# Patient Record
Sex: Male | Born: 1955
Health system: Southern US, Community
[De-identification: ages and names within clinical notes are randomized; demographics above are authoritative.]

## PROBLEM LIST (undated history)

## (undated) DIAGNOSIS — J449 Chronic obstructive pulmonary disease, unspecified: Secondary | ICD-10-CM

## (undated) DIAGNOSIS — G8929 Other chronic pain: Secondary | ICD-10-CM

## (undated) DIAGNOSIS — B029 Zoster without complications: Secondary | ICD-10-CM

## (undated) DIAGNOSIS — G40909 Epilepsy, unspecified, not intractable, without status epilepticus: Secondary | ICD-10-CM

## (undated) DIAGNOSIS — M549 Dorsalgia, unspecified: Secondary | ICD-10-CM

## (undated) DIAGNOSIS — Z973 Presence of spectacles and contact lenses: Secondary | ICD-10-CM

## (undated) DIAGNOSIS — J40 Bronchitis, not specified as acute or chronic: Secondary | ICD-10-CM

## (undated) HISTORY — DX: Dorsalgia, unspecified: M54.9

## (undated) HISTORY — DX: Other chronic pain: G89.29

## (undated) HISTORY — DX: Chronic obstructive pulmonary disease, unspecified: J44.9

## (undated) HISTORY — DX: Epilepsy, unspecified, not intractable, without status epilepticus: G40.909

## (undated) HISTORY — PX: MANDIBLE SURGERY: SHX707

## (undated) HISTORY — DX: Presence of spectacles and contact lenses: Z97.3

## (undated) HISTORY — DX: Bronchitis, not specified as acute or chronic: J40

## (undated) HISTORY — PX: TONSILLECTOMY AND ADENOIDECTOMY: SUR1326

## (undated) HISTORY — DX: Zoster without complications: B02.9

---

## 2004-03-22 ENCOUNTER — Emergency Department (HOSPITAL_COMMUNITY): Admission: EM | Admit: 2004-03-22 | Discharge: 2004-03-22 | Payer: Self-pay | Admitting: *Deleted

## 2004-06-08 ENCOUNTER — Emergency Department (HOSPITAL_COMMUNITY): Admission: EM | Admit: 2004-06-08 | Discharge: 2004-06-08 | Payer: Self-pay | Admitting: Emergency Medicine

## 2004-08-16 ENCOUNTER — Emergency Department (HOSPITAL_COMMUNITY): Admission: EM | Admit: 2004-08-16 | Discharge: 2004-08-16 | Payer: Self-pay | Admitting: Emergency Medicine

## 2007-07-27 ENCOUNTER — Emergency Department (HOSPITAL_COMMUNITY): Admission: EM | Admit: 2007-07-27 | Discharge: 2007-07-27 | Payer: Self-pay | Admitting: Emergency Medicine

## 2007-08-27 ENCOUNTER — Emergency Department (HOSPITAL_COMMUNITY): Admission: EM | Admit: 2007-08-27 | Discharge: 2007-08-27 | Payer: Self-pay | Admitting: Emergency Medicine

## 2008-11-07 IMAGING — CR DG CHEST 2V
2 series · 2 of 2 positions shown · non-contrast
Comparison: 07/27/2007.

CLINICAL DATA: Increasing upper back pain.  Recently diagnosed
right rib fracture.

CHEST - 2 VIEW

[w chest pa]
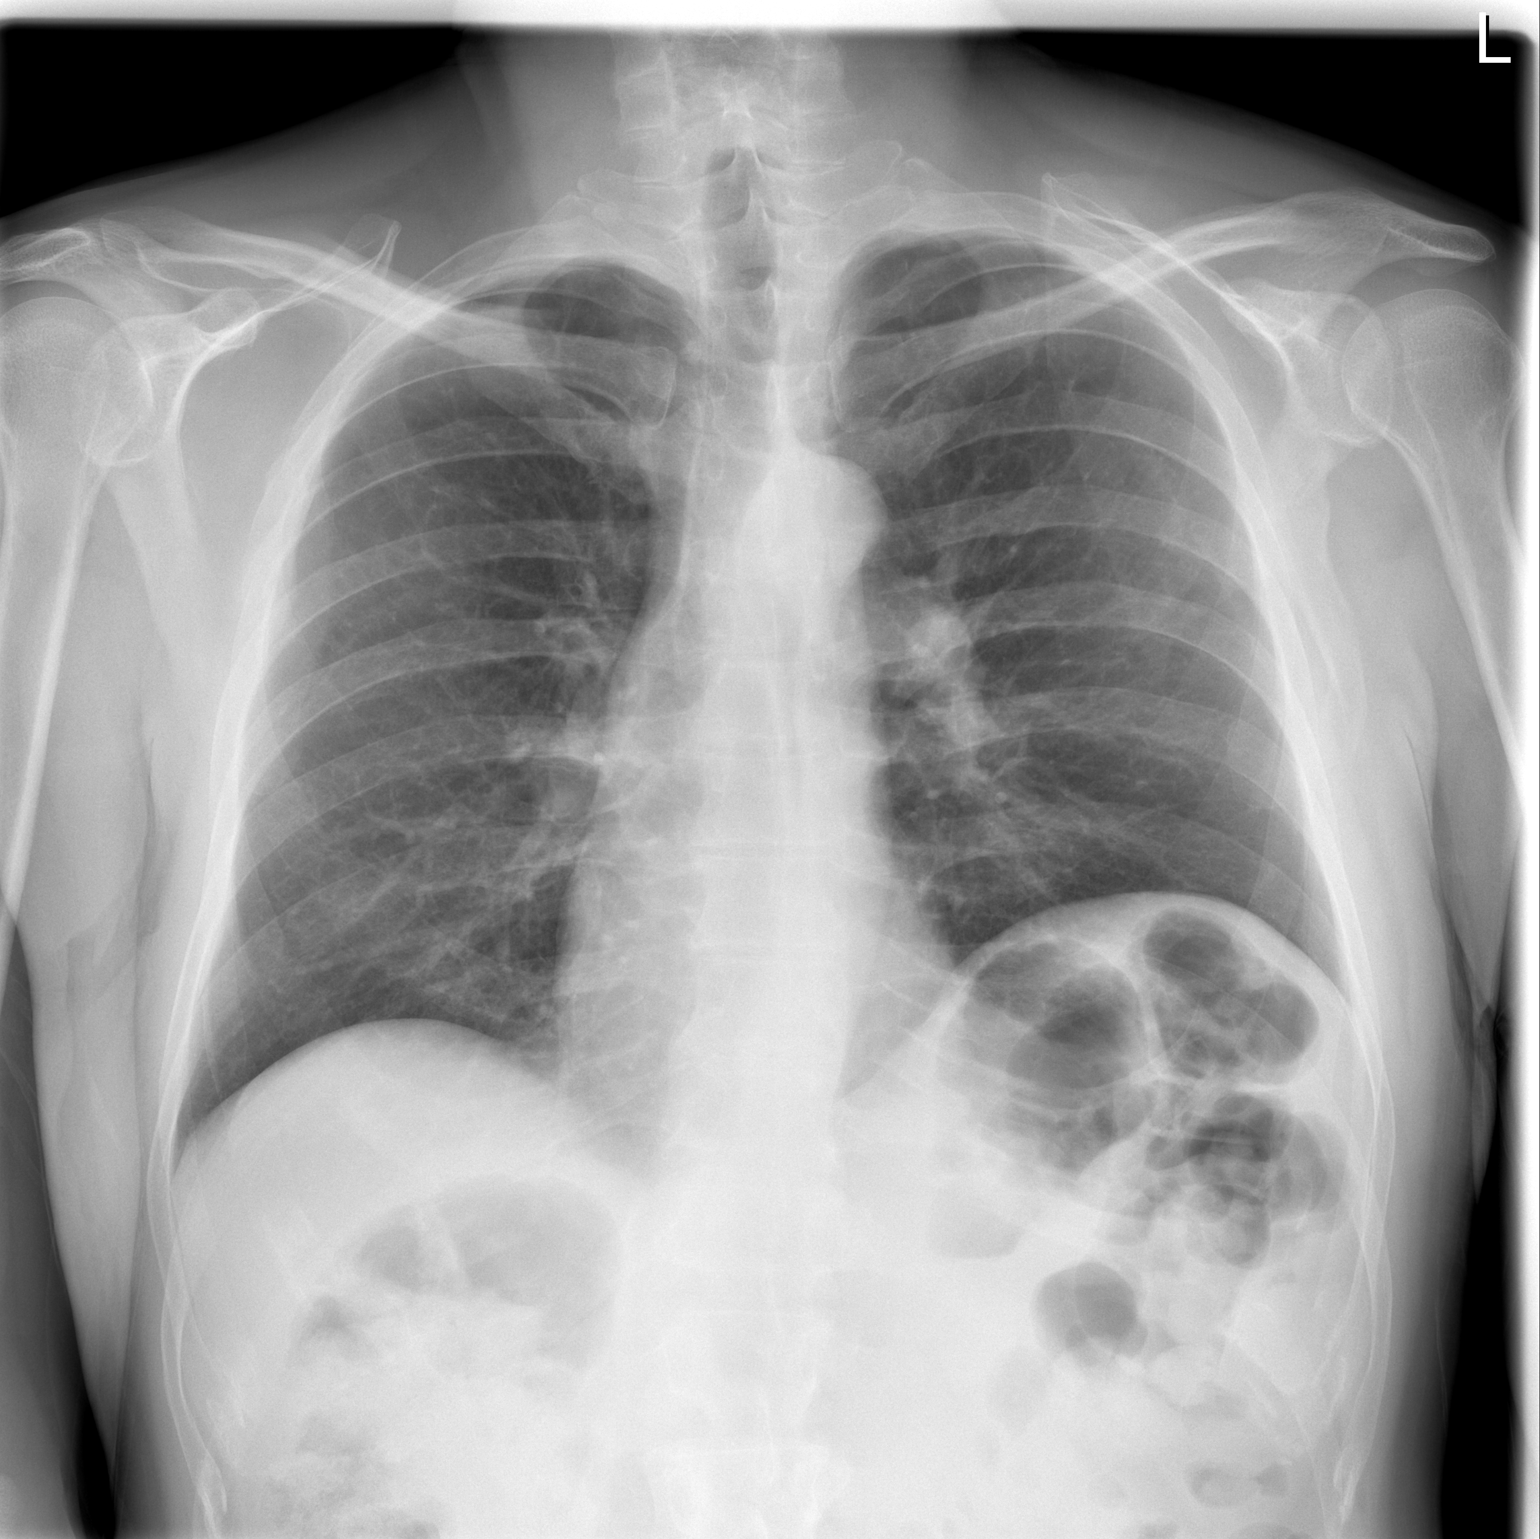

[w chest lat]
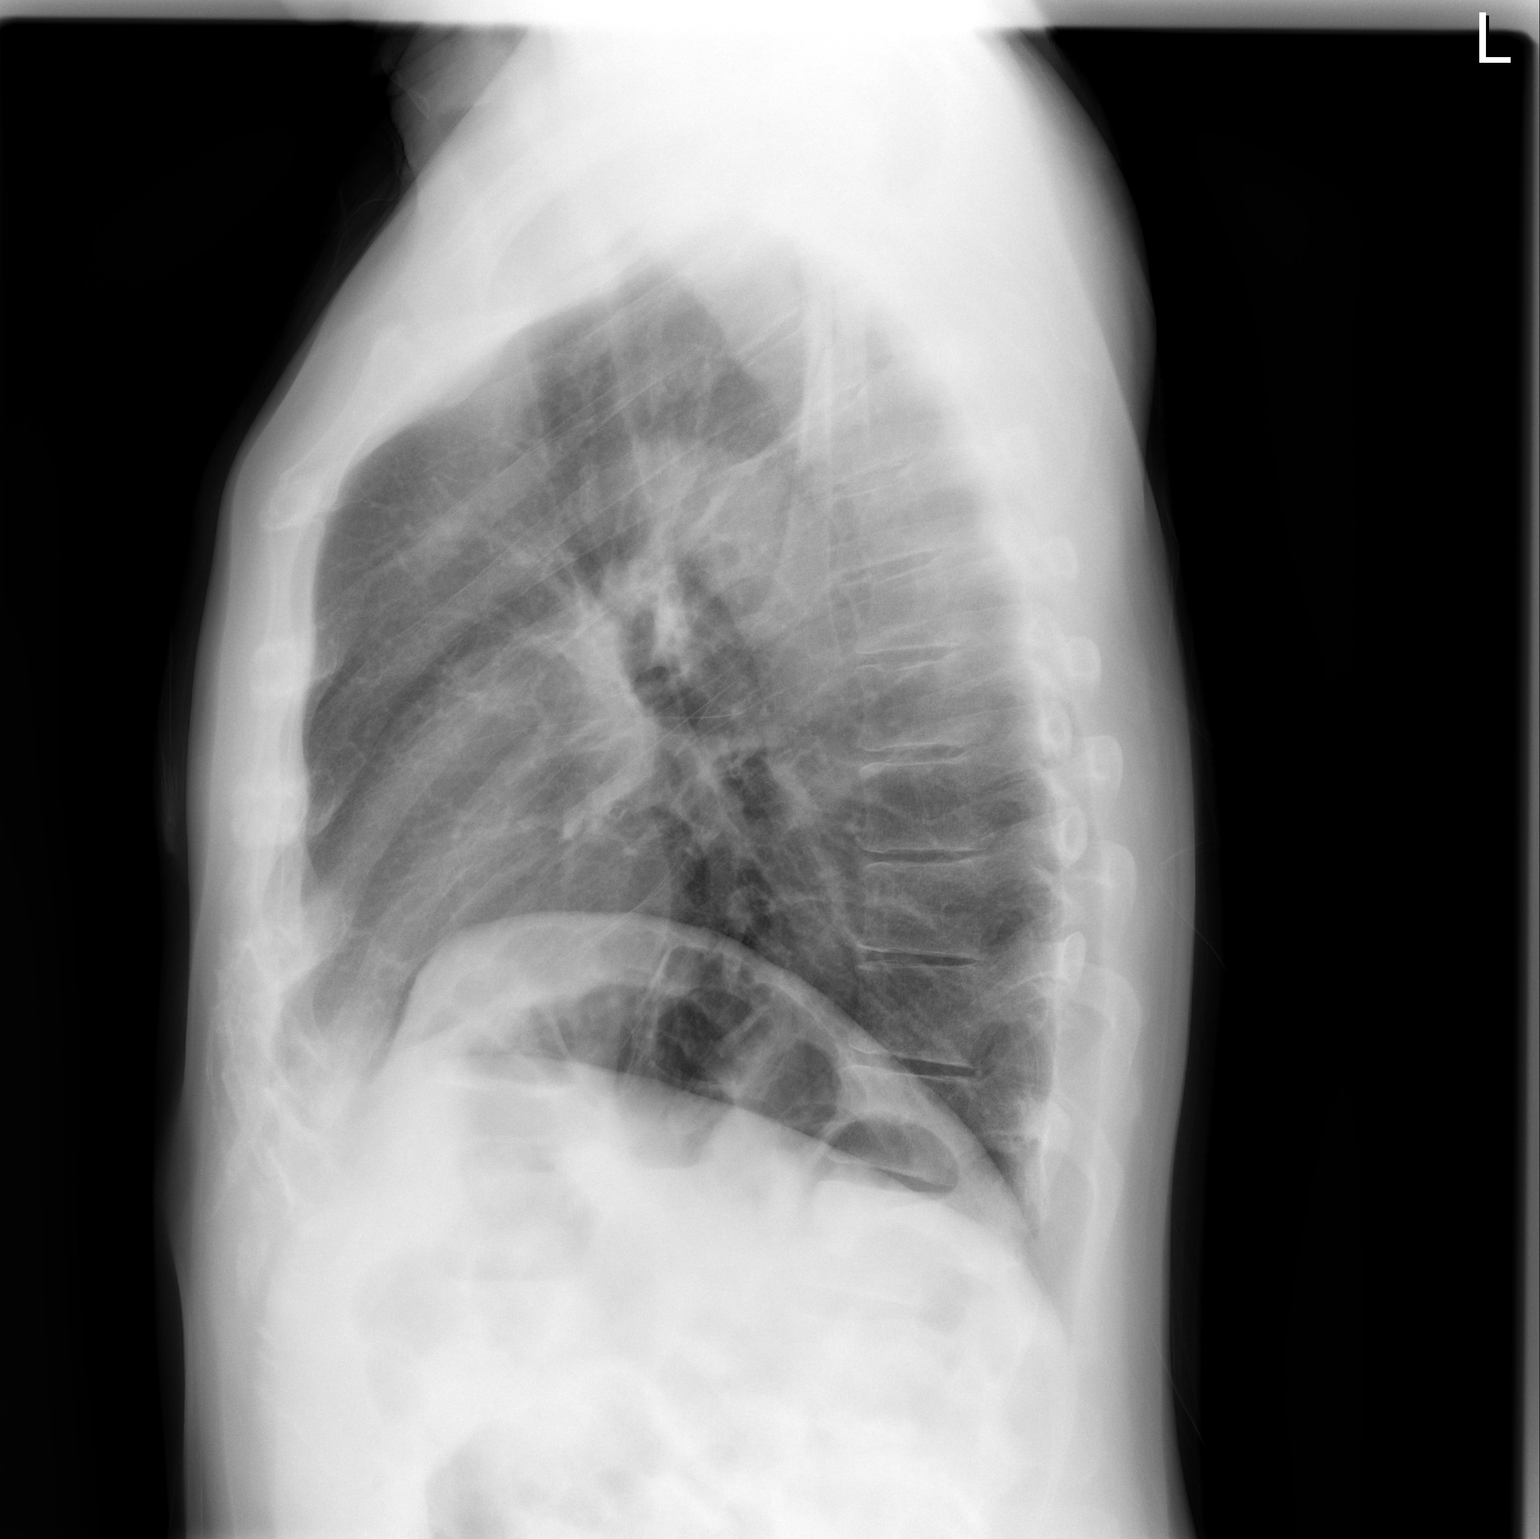

[2 of 2 positions shown; findings below may reference images not displayed]

FINDINGS: Interval partial healing of the previously seen right
lateral fifth rib fracture.  Stable old, healed right lateral 4th
rib fracture.  Normal sized heart.  Clear lungs.  Stable mild
elevation of the left hemidiaphragm.  Mild scoliosis.
IMPRESSION: Healing right fifth rib fracture.  No acute abnormality.

## 2009-06-08 ENCOUNTER — Emergency Department (HOSPITAL_BASED_OUTPATIENT_CLINIC_OR_DEPARTMENT_OTHER): Admission: EM | Admit: 2009-06-08 | Discharge: 2009-06-08 | Payer: Self-pay | Admitting: Emergency Medicine

## 2009-06-25 ENCOUNTER — Emergency Department (HOSPITAL_BASED_OUTPATIENT_CLINIC_OR_DEPARTMENT_OTHER): Admission: EM | Admit: 2009-06-25 | Discharge: 2009-06-25 | Payer: Self-pay | Admitting: Emergency Medicine

## 2009-07-01 ENCOUNTER — Emergency Department (HOSPITAL_BASED_OUTPATIENT_CLINIC_OR_DEPARTMENT_OTHER): Admission: EM | Admit: 2009-07-01 | Discharge: 2009-07-01 | Payer: Self-pay | Admitting: Emergency Medicine

## 2010-03-02 ENCOUNTER — Emergency Department (INDEPENDENT_AMBULATORY_CARE_PROVIDER_SITE_OTHER)
Admission: EM | Admit: 2010-03-02 | Discharge: 2010-03-02 | Payer: Self-pay | Source: Home / Self Care | Admitting: Emergency Medicine

## 2010-03-02 DIAGNOSIS — R109 Unspecified abdominal pain: Secondary | ICD-10-CM

## 2010-03-02 LAB — URINALYSIS, ROUTINE W REFLEX MICROSCOPIC
Bilirubin Urine: NEGATIVE
Hgb urine dipstick: NEGATIVE
Ketones, ur: NEGATIVE mg/dL
Nitrite: NEGATIVE
Protein, ur: NEGATIVE mg/dL
Specific Gravity, Urine: 1.01 (ref 1.005–1.030)
Urine Glucose, Fasting: 500 mg/dL — AB
Urobilinogen, UA: 0.2 mg/dL (ref 0.0–1.0)
pH: 6.5 (ref 5.0–8.0)

## 2010-03-02 LAB — DIFFERENTIAL
Basophils Absolute: 0.1 K/uL (ref 0.0–0.1)
Basophils Relative: 3 % — ABNORMAL HIGH (ref 0–1)
Eosinophils Absolute: 0.2 10*3/uL (ref 0.0–0.7)
Eosinophils Relative: 4 % (ref 0–5)
Lymphocytes Relative: 33 % (ref 12–46)
Lymphs Abs: 1.9 10*3/uL (ref 0.7–4.0)
Monocytes Absolute: 0.6 K/uL (ref 0.1–1.0)
Monocytes Relative: 11 % (ref 3–12)
Neutro Abs: 2.8 K/uL (ref 1.7–7.7)
Neutrophils Relative %: 49 % (ref 43–77)

## 2010-03-02 LAB — COMPREHENSIVE METABOLIC PANEL WITH GFR
ALT: 53 U/L (ref 0–53)
AST: 39 U/L — ABNORMAL HIGH (ref 0–37)
Albumin: 4.3 g/dL (ref 3.5–5.2)
Alkaline Phosphatase: 130 U/L — ABNORMAL HIGH (ref 39–117)
BUN: 13 mg/dL (ref 6–23)
Chloride: 100 meq/L (ref 96–112)
GFR calc Af Amer: >60 mL/min (ref >60)
Potassium: 4.1 meq/L (ref 3.5–5.1)
Sodium: 139 meq/L (ref 135–145)
Total Bilirubin: 0.8 mg/dL (ref 0.3–1.2)
Total Protein: 7.5 g/dL (ref 6.0–8.3)

## 2010-03-02 LAB — COMPREHENSIVE METABOLIC PANEL
CO2: 27 mEq/L (ref 19–32)
Calcium: 9.5 mg/dL (ref 8.4–10.5)
Creatinine, Ser: 0.9 mg/dL (ref 0.4–1.5)
GFR calc non Af Amer: >60 mL/min (ref >60)
Glucose, Bld: 299 mg/dL — ABNORMAL HIGH (ref 70–99)

## 2010-03-02 LAB — CBC
HCT: 41.5 % (ref 39.0–52.0)
Hemoglobin: 15.2 g/dL (ref 13.0–17.0)
MCH: 29.9 pg (ref 26.0–34.0)
MCHC: 36.6 g/dL — ABNORMAL HIGH (ref 30.0–36.0)
MCV: 81.7 fL (ref 78.0–100.0)
Platelets: 196 10*3/uL (ref 150–400)
RBC: 5.08 MIL/uL (ref 4.22–5.81)
RDW: 12.6 % (ref 11.5–15.5)
WBC: 5.6 10*3/uL (ref 4.0–10.5)

## 2010-04-23 ENCOUNTER — Encounter (INDEPENDENT_AMBULATORY_CARE_PROVIDER_SITE_OTHER): Payer: Self-pay | Admitting: *Deleted

## 2010-04-23 LAB — CONVERTED CEMR LAB
Amphetamine Screen, Ur: NEGATIVE
Barbiturate Quant, Ur: NEGATIVE
Cocaine Metabolites: NEGATIVE
Phencyclidine (PCP): NEGATIVE

## 2010-05-13 ENCOUNTER — Other Ambulatory Visit: Payer: Self-pay | Admitting: General Surgery

## 2010-05-13 ENCOUNTER — Encounter (HOSPITAL_COMMUNITY): Payer: Self-pay | Attending: General Surgery

## 2010-05-13 DIAGNOSIS — K8020 Calculus of gallbladder without cholecystitis without obstruction: Secondary | ICD-10-CM | POA: Insufficient documentation

## 2010-05-13 DIAGNOSIS — Z01812 Encounter for preprocedural laboratory examination: Secondary | ICD-10-CM | POA: Insufficient documentation

## 2010-05-13 DIAGNOSIS — J449 Chronic obstructive pulmonary disease, unspecified: Secondary | ICD-10-CM | POA: Insufficient documentation

## 2010-05-13 DIAGNOSIS — Z01810 Encounter for preprocedural cardiovascular examination: Secondary | ICD-10-CM | POA: Insufficient documentation

## 2010-05-13 DIAGNOSIS — E119 Type 2 diabetes mellitus without complications: Secondary | ICD-10-CM | POA: Insufficient documentation

## 2010-05-13 DIAGNOSIS — J4489 Other specified chronic obstructive pulmonary disease: Secondary | ICD-10-CM | POA: Insufficient documentation

## 2010-05-13 DIAGNOSIS — Z79899 Other long term (current) drug therapy: Secondary | ICD-10-CM | POA: Insufficient documentation

## 2010-05-13 LAB — DIFFERENTIAL
Basophils Absolute: 0.1 10*3/uL (ref 0.0–0.1)
Basophils Relative: 1 % (ref 0–1)
Eosinophils Relative: 6 % — ABNORMAL HIGH (ref 0–5)
Monocytes Absolute: 0.5 10*3/uL (ref 0.1–1.0)
Monocytes Relative: 9 % (ref 3–12)

## 2010-05-13 LAB — COMPREHENSIVE METABOLIC PANEL
Albumin: 3.6 g/dL (ref 3.5–5.2)
Alkaline Phosphatase: 86 U/L (ref 39–117)
BUN: 8 mg/dL (ref 6–23)
Creatinine, Ser: 0.84 mg/dL (ref 0.4–1.5)
Glucose, Bld: 199 mg/dL — ABNORMAL HIGH (ref 70–99)
Total Bilirubin: 0.6 mg/dL (ref 0.3–1.2)
Total Protein: 6.6 g/dL (ref 6.0–8.3)

## 2010-05-13 LAB — CBC
HCT: 39.6 % (ref 39.0–52.0)
Hemoglobin: 13.7 g/dL (ref 13.0–17.0)
MCH: 29.8 pg (ref 26.0–34.0)
MCHC: 34.6 g/dL (ref 30.0–36.0)
MCV: 86.3 fL (ref 78.0–100.0)
RDW: 13 % (ref 11.5–15.5)

## 2010-05-13 LAB — SURGICAL PCR SCREEN
MRSA, PCR: NEGATIVE
Staphylococcus aureus: NEGATIVE

## 2010-05-18 ENCOUNTER — Ambulatory Visit (HOSPITAL_COMMUNITY)
Admission: RE | Admit: 2010-05-18 | Discharge: 2010-05-19 | Disposition: A | Discharge status: Home / Self Care | Payer: Self-pay | Source: Ambulatory Visit | Attending: General Surgery | Admitting: General Surgery

## 2010-05-18 ENCOUNTER — Ambulatory Visit (HOSPITAL_COMMUNITY): Payer: Self-pay

## 2010-05-18 ENCOUNTER — Other Ambulatory Visit: Payer: Self-pay | Admitting: General Surgery

## 2010-05-18 DIAGNOSIS — K429 Umbilical hernia without obstruction or gangrene: Secondary | ICD-10-CM | POA: Insufficient documentation

## 2010-05-18 DIAGNOSIS — R1011 Right upper quadrant pain: Secondary | ICD-10-CM | POA: Insufficient documentation

## 2010-05-18 DIAGNOSIS — K8010 Calculus of gallbladder with chronic cholecystitis without obstruction: Secondary | ICD-10-CM | POA: Insufficient documentation

## 2010-05-18 HISTORY — PX: LAPAROSCOPIC CHOLECYSTECTOMY: SUR755

## 2010-05-18 LAB — GLUCOSE, CAPILLARY: Glucose-Capillary: 181 mg/dL — ABNORMAL HIGH (ref 70–99)

## 2010-05-19 LAB — GLUCOSE, CAPILLARY
Glucose-Capillary: 134 mg/dL — ABNORMAL HIGH (ref 70–99)
Glucose-Capillary: 179 mg/dL — ABNORMAL HIGH (ref 70–99)
Glucose-Capillary: 127 mg/dL — ABNORMAL HIGH (ref 70–99)
Glucose-Capillary: 108 mg/dL — ABNORMAL HIGH (ref 70–99)

## 2010-05-26 NOTE — Op Note (Signed)
NAMEJAMEEK, BRUNTZ            ACCOUNT NO.:  [id number]  MEDICAL RECORD NO.:  [id number]           PATIENT TYPE:  O  LOCATION:  1441                         FACILITY:  [facility]  PHYSICIAN:  Mary Sella. Andrey Campanile, MD     DATE OF BIRTH:  1955-12-05  DATE OF PROCEDURE:  05/18/2010 DATE OF DISCHARGE:                              OPERATIVE REPORT   PREOPERATIVE DIAGNOSIS:  Symptomatic cholelithiasis.  POSTOPERATIVE DIAGNOSES: 1. Chronic cholecystitis. 2. Umbilical hernia.  PROCEDURE: 1. Laparoscopic cholecystectomy with intraoperative cholangiogram. 2. Open primary repair of umbilical hernia.  SURGEON:  Mary Sella. Andrey Campanile, MD  ASSISTANT SURGEON:  Lorne Skeens. Hoxworth, MD  ANESTHESIA:  General plus local, consisting of 0.25% Marcaine with epinephrine.  FINDINGS:  Critical view was obtained.  The cholangiogram demonstrated prompt filling of the cystic common hepatic, common bile duct, left to right hepatic duct as well as prompt emptying into the duodenum.  There is no evidence of filling defect.  He had a small umbilical hernia which was less than 1 inch in diameter.  INDICATIONS FOR PROCEDURE:  The patient is a very pleasant 55 year old gentleman.  He has had intermittent episodes of right upper quadrant pain and midabdomen initially in January that prompted him to go to [employer].  A CT scan demonstrated multiple gallstones.  Since that time, he has had intermittent pain on the right side which he describes as pressure sensation.  He says the pain is particularly worse after eating or drinking certain such as coffee or any fries.  We discussed the risks and benefits of surgery including bleeding, infection, injury to surrounding structures, injury to the common bile duct, requiring major reconstructive bile duct surgery, prolonged diarrhea, bile leak, need to convert to an open procedure, wound complications, DVT occurrence.  He elected to proceed to surgery.  DESCRIPTION OF  PROCEDURE:  After obtaining informed consent, the patient was brought to the operating room, placed supine on the operating table. General endotracheal anesthesia was established.  Sequential compression devices already have been placed.  Surgical time-out was performed.  He received antibiotics prior to skin incision.  His abdomen was prepped and draped in usual standard surgical fashion.  Upon palpating his abdomen, I noticed that he had a very small fascial defect in his umbilicus, initially felt to be 1/2 cm.  Therefore, I placed local underneath this infraumbilically in the dermis and subcutaneous tissue. I then made a transverse infraumbilical incision about 3.5 cm in length. The subcutaneous tissue was spread, the fascia was grasped, and lifted anteriorly.  Neck of the fascia was incised with a #11 blade, the abdominal cavity was entered.  A purse-string suture consisting of 0 Vicryl and UR-6 needle was placed around the fascial edges.  The Hasson trocar was placed directly into the abdominal cavity.  Pneumoperitoneum was easily established up to a patient pressure of 15 mmHg.  The laparoscope was advanced and the abdominal cavity was surveilled, there was no evidence of adhesions of the abdominal wall.  The patient was placed in reverse Trendelenburg and rotated slightly to the left.  I then placed three 5 mm trocars, 1 in the  subxiphoid, and 2 in the right hypochondrium, all under direct visualization after local had been infiltrated.  We then grasped the gallbladder which was to the right shoulder.  He had some omental attachments which were stripped down in a blunt fashion.  The infundibulum was grasped, retracted laterally.  I then incised the peritoneum both medially and laterally with hook electrocautery.  The cystic artery was identified as well as the cystic duct.  The cystic duct was circumferentially dissected out.  The cystic artery had an anterior and posterior branch,  each was circumferentially dissected out and around.  The critical view was obtained.  I placed a clip on the distal cystic duct as it entered the gallbladder.  This was then partially transected with Endo Shears.  The [facility] cholangiogram catheter was placed percutaneously through the abdominal wall and threaded into the cystic duct and secured with clip.  The cholangiogram was performed with the results as described above.  Pneumoperitoneum was re-established.  The clip securing the cholangiogram catheter was removed. Three clips were placed in the downside of the cystic duct.  I then placed 2 clips on the downside of the posterior branch of the cystic artery and then ligated it distally with hook electrocautery. With respect to the anterior branch, I placed 2 clips downstream and 1 clip on the gallbladder side.  It was then transected with Endo Shears. We then mobilized the gallbladder up of the gallbladder fossa using hook electrocautery.  It was eventually freed.  The laparoscope was then placed in the subxiphoid trocar and the endobag was advanced, the umbilical trocar in place and the gallbladder was placed within the specimen bag.  The gallbladder and specimen bag were then removed from the abdominal cavity and passed off the field.  The Hasson trocar was free of place.  Pneumoperitoneum was re-established.  The gallbladder fossa was inspected.  Hemostasis was achieved with electrocautery.  I then irrigated the right upper quadrant.  There was no evidence of bile leak.  Hemostasis was assured.  I then removed the Hasson trocar and tied down the previously placed purse-string suture.  There was still a gap in the fascia superiorly to tie down purse-string suture.  At this point, I elected to unroof the base of the umbilicus off the fascia. This was done with both Metzenbaum as well as with electrocautery.  Once the belly button was unroofed off the fascia, we cleaned up the  fascial edges with Bovie electrocautery.  I then closed the fascial defect in a transverse fashion with interrupted 0 Vicryl sutures x5.  These were tied down.  Pneumoperitoneum was re-established and I looked through the subxiphoid trocar, there was no air leak at the fascia.  The fascial defect was completely closed.  I then placed some local in the preperitoneal plane at the umbilicus.  The pneumoperitoneum was released and the remaining trocars were removed.  I then tacked the base of the umbilicus back down to the fascia with two 3-0 Vicryl sutures.  All skin incisions were then closed with a 4-0 Monocryl in a subcuticular fashion followed by the application of benzoin.  All needle, instrument, and sponge counts were correct x2.  There were no immediate complications. The patient tolerated the procedure well.     Mary Sella. Andrey Campanile, MD     EMW/MEDQ  D:  05/18/2010  T:  05/19/2010  Job:  [id number]  Electronically Signed by Gaynelle Adu M.D. on 05/26/2010 08:30:45 AM

## 2010-07-16 ENCOUNTER — Encounter (INDEPENDENT_AMBULATORY_CARE_PROVIDER_SITE_OTHER): Payer: Self-pay | Admitting: General Surgery

## 2010-07-22 ENCOUNTER — Other Ambulatory Visit (HOSPITAL_COMMUNITY): Payer: Self-pay | Admitting: Family Medicine

## 2010-07-22 ENCOUNTER — Ambulatory Visit (HOSPITAL_COMMUNITY)
Admission: RE | Admit: 2010-07-22 | Discharge: 2010-07-22 | Disposition: A | Discharge status: Home / Self Care | Payer: Self-pay | Source: Ambulatory Visit | Attending: Family Medicine | Admitting: Family Medicine

## 2010-07-22 ENCOUNTER — Encounter (HOSPITAL_COMMUNITY)
Admission: RE | Admit: 2010-07-22 | Discharge: 2010-07-22 | Disposition: A | Discharge status: Home / Self Care | Payer: Self-pay | Source: Ambulatory Visit | Attending: Family Medicine | Admitting: Family Medicine

## 2010-07-22 DIAGNOSIS — R0789 Other chest pain: Secondary | ICD-10-CM | POA: Insufficient documentation

## 2010-07-22 DIAGNOSIS — M549 Dorsalgia, unspecified: Secondary | ICD-10-CM

## 2010-07-22 DIAGNOSIS — Z9089 Acquired absence of other organs: Secondary | ICD-10-CM | POA: Insufficient documentation

## 2010-10-29 LAB — COMPREHENSIVE METABOLIC PANEL
Alkaline Phosphatase: 94
BUN: 10
Chloride: 99
Creatinine, Ser: 0.84
GFR calc non Af Amer: >60
Glucose, Bld: 186 — ABNORMAL HIGH
Potassium: 4.1
Total Bilirubin: 0.6

## 2010-10-29 LAB — POCT CARDIAC MARKERS
Myoglobin, poc: 70.9
Operator id: 146091
Operator id: 294501

## 2010-10-29 LAB — CBC
Hemoglobin: 14
MCHC: 34.2
Platelets: 311
RDW: 13.5

## 2010-10-29 LAB — DIFFERENTIAL
Basophils Absolute: 0.1
Basophils Relative: 1
Eosinophils Relative: 6 — ABNORMAL HIGH
Lymphocytes Relative: 20
Monocytes Absolute: 0.7
Neutro Abs: 5.8

## 2010-10-29 LAB — D-DIMER, QUANTITATIVE: D-Dimer, Quant: 1.63 — ABNORMAL HIGH

## 2010-10-30 LAB — POCT I-STAT, CHEM 8
BUN: 7
Calcium, Ion: 1.1 — ABNORMAL LOW
Creatinine, Ser: 0.9
Glucose, Bld: 255 — ABNORMAL HIGH
TCO2: 25

## 2011-06-21 ENCOUNTER — Encounter: Payer: Self-pay | Admitting: Physical Medicine and Rehabilitation

## 2011-08-23 ENCOUNTER — Encounter: Payer: Self-pay | Admitting: Physical Medicine and Rehabilitation

## 2011-09-18 ENCOUNTER — Other Ambulatory Visit: Payer: Self-pay | Admitting: Family Medicine

## 2011-11-03 ENCOUNTER — Ambulatory Visit: Payer: Self-pay | Admitting: Physical Medicine and Rehabilitation

## 2013-11-07 ENCOUNTER — Other Ambulatory Visit: Payer: Self-pay | Admitting: Family Medicine

## 2013-11-07 ENCOUNTER — Ambulatory Visit
Admission: RE | Admit: 2013-11-07 | Discharge: 2013-11-07 | Disposition: A | Discharge status: Home / Self Care | Payer: Commercial Managed Care - HMO | Source: Ambulatory Visit | Attending: Family Medicine | Admitting: Family Medicine

## 2013-11-07 DIAGNOSIS — R05 Cough: Secondary | ICD-10-CM

## 2013-11-07 DIAGNOSIS — R059 Cough, unspecified: Secondary | ICD-10-CM

## 2013-11-07 DIAGNOSIS — R0989 Other specified symptoms and signs involving the circulatory and respiratory systems: Secondary | ICD-10-CM

## 2016-12-14 ENCOUNTER — Emergency Department (HOSPITAL_BASED_OUTPATIENT_CLINIC_OR_DEPARTMENT_OTHER): Payer: Commercial Managed Care - HMO

## 2016-12-14 ENCOUNTER — Emergency Department (HOSPITAL_BASED_OUTPATIENT_CLINIC_OR_DEPARTMENT_OTHER)
Admission: EM | Admit: 2016-12-14 | Discharge: 2016-12-14 | Disposition: A | Discharge status: Home / Self Care | Payer: Commercial Managed Care - HMO | Attending: Emergency Medicine | Admitting: Emergency Medicine

## 2016-12-14 ENCOUNTER — Encounter (HOSPITAL_BASED_OUTPATIENT_CLINIC_OR_DEPARTMENT_OTHER): Payer: Self-pay

## 2016-12-14 ENCOUNTER — Other Ambulatory Visit: Payer: Self-pay

## 2016-12-14 DIAGNOSIS — Y929 Unspecified place or not applicable: Secondary | ICD-10-CM | POA: Insufficient documentation

## 2016-12-14 DIAGNOSIS — Z7984 Long term (current) use of oral hypoglycemic drugs: Secondary | ICD-10-CM | POA: Diagnosis not present

## 2016-12-14 DIAGNOSIS — Y92009 Unspecified place in unspecified non-institutional (private) residence as the place of occurrence of the external cause: Secondary | ICD-10-CM

## 2016-12-14 DIAGNOSIS — R0781 Pleurodynia: Secondary | ICD-10-CM | POA: Insufficient documentation

## 2016-12-14 DIAGNOSIS — W01198A Fall on same level from slipping, tripping and stumbling with subsequent striking against other object, initial encounter: Secondary | ICD-10-CM | POA: Insufficient documentation

## 2016-12-14 DIAGNOSIS — Y9389 Activity, other specified: Secondary | ICD-10-CM | POA: Diagnosis not present

## 2016-12-14 DIAGNOSIS — J449 Chronic obstructive pulmonary disease, unspecified: Secondary | ICD-10-CM | POA: Diagnosis not present

## 2016-12-14 DIAGNOSIS — Z87891 Personal history of nicotine dependence: Secondary | ICD-10-CM | POA: Diagnosis not present

## 2016-12-14 DIAGNOSIS — Y998 Other external cause status: Secondary | ICD-10-CM | POA: Insufficient documentation

## 2016-12-14 DIAGNOSIS — S20211A Contusion of right front wall of thorax, initial encounter: Secondary | ICD-10-CM | POA: Insufficient documentation

## 2016-12-14 DIAGNOSIS — E119 Type 2 diabetes mellitus without complications: Secondary | ICD-10-CM | POA: Diagnosis not present

## 2016-12-14 DIAGNOSIS — S299XXA Unspecified injury of thorax, initial encounter: Secondary | ICD-10-CM | POA: Diagnosis present

## 2016-12-14 DIAGNOSIS — W19XXXA Unspecified fall, initial encounter: Secondary | ICD-10-CM

## 2016-12-14 MED ORDER — NAPROXEN 375 MG PO TABS: 375.0000 mg | ORAL_TABLET | Freq: Two times a day (BID) | ORAL | 0 refills | Status: AC

## 2016-12-14 MED ORDER — HYDROCODONE-ACETAMINOPHEN 5-325 MG PO TABS: 1.0000 | ORAL_TABLET | Freq: Four times a day (QID) | ORAL | 0 refills | Status: DC | PRN

## 2016-12-14 NOTE — ED Provider Notes (Signed)
[facility] EMERGENCY DEPARTMENT Provider Note   CSN: [id number] Arrival date & time: 12/14/16  1525     History   Chief Complaint Chief Complaint  Patient presents with  . Fall    HPI Carlos Shelton is a 61 y.o. male.  Patient with history of prior right sided rib injury (10 years+). This past Sunday, patient reports he fell and struck the posterior aspect of his right chest wall on an air conditioning unit. Has been having persistent pain in the area since the injury. Hurts to take a deep breath, as well as with palpation.    Chest Pain   This is a new problem. The current episode started more than 2 days ago. The problem has not changed since onset.The pain is associated with breathing. Pain location: posterior right chest wall. The pain is moderate. The quality of the pain is described as sharp. The pain does not radiate. The symptoms are aggravated by deep breathing.    Past Medical History:  Diagnosis Date  . Bronchitis   . Chronic back pain   . COPD (chronic obstructive pulmonary disease) (HCC)   . Diabetes mellitus    type 2  . Epilepsy (HCC)   . Migraine   . Shingles   . Wears glasses     There are no active problems to display for this patient.   Past Surgical History:  Procedure Laterality Date  . LAPAROSCOPIC CHOLECYSTECTOMY  05/18/10   with umbilical hernia repair  . MANDIBLE SURGERY    . TONSILLECTOMY AND ADENOIDECTOMY         Home Medications    Prior to Admission medications   Medication Sig Start Date End Date Taking? Authorizing Provider  GLIPIZIDE PO Take by mouth.   Yes [provider]    Family History Family History  Problem Relation Age of Onset  . Breast cancer Mother   . Diabetes Mother   . Heart disease Father   . COPD Brother   . Cancer Sister     Social History Social History   Tobacco Use  . Smoking status: Former Smoker    Packs/day: 0.50  . Smokeless tobacco: Never Used  Substance Use  Topics  . Alcohol use: No  . Drug use: No     Allergies   Patient has no known allergies.   Review of Systems Review of Systems  Cardiovascular: Positive for chest pain.  All other systems reviewed and are negative.    Physical Exam Updated Vital Signs BP (!) 144/86 (BP Location: Left Arm)   Pulse 71   Temp 97.7 F (36.5 C) (Oral)   Resp 18   Ht 5\' 11"  (1.803 m)   Wt 63.5 kg (140 lb)   SpO2 100%   BMI 19.53 kg/m   Physical Exam  Constitutional: He is oriented to person, place, and time. He appears well-developed and well-nourished.  HENT:  Head: Atraumatic.  Eyes: Conjunctivae are normal.  Neck: Neck supple.  Cardiovascular: Normal rate and regular rhythm.  Pulmonary/Chest: Effort normal and breath sounds normal.  Abdominal: Soft. Bowel sounds are normal.  Musculoskeletal: Normal range of motion. He exhibits tenderness. He exhibits no edema.  Bruising noted to right lower posterior chest wall with tenderness to palpation.  Neurological: He is alert and oriented to person, place, and time.  Skin: Skin is warm and dry.  Psychiatric: He has a normal mood and affect.  Nursing note and vitals reviewed.    ED Treatments /  Results  Labs (all labs ordered are listed, but only abnormal results are displayed) Labs Reviewed - No data to display  EKG  EKG Interpretation None       Radiology Dg Ribs Unilateral W/chest Right  Result Date: 12/14/2016 CLINICAL DATA:  Right lateral rib pain.  Prior injury. EXAM: RIGHT RIBS AND CHEST - 3+ VIEW COMPARISON:  11/07/2013 . FINDINGS: Mediastinum and hilar structures normal. Heart size normal. Stable chronic interstitial changes. Elevation left hemidiaphragm. Old right rib fractures. Degenerative changes thoracic spine. Surgical clips right upper quadrant. Stool noted throughout the colon. IMPRESSION: Old right rib fractures. Exam is otherwise unremarkable . Stable chronic interstitial lung disease. No pneumothorax. Stable  elevation left hemidiaphragm. Electronically Signed   By: Maisie Fushomas  Register   On: 12/14/2016 15:57    Procedures Procedures (including critical care time)  Medications Ordered in ED Medications - No data to display   Initial Impression / Assessment and Plan / ED Course  I have reviewed the triage vital signs and the nursing notes.  Pertinent labs & imaging results that were available during my care of the patient were reviewed by me and considered in my medical decision making (see chart for details).     Patient with fall at home. No new rib fracture on xray. Posterior right chest wall and rib contusion. Care instructions and return precautions discussed. Patient to follow-up with his PCP.  Final Clinical Impressions(s) / ED Diagnoses   Final diagnoses:  Fall in home, initial encounter  Contusion of right chest wall, initial encounter  Rib pain on right side    ED Discharge Orders        Ordered    HYDROcodone-acetaminophen (NORCO/VICODIN) 5-325 MG tablet  Every 6 hours PRN     12/14/16 1616    naproxen (NAPROSYN) 375 MG tablet  2 times daily     12/14/16 1616       Felicie MornSmith, Jalacia Mattila, NP 12/14/16 1621    Rolland PorterJames, Mark, MD 12/19/16 2306

## 2016-12-14 NOTE — Discharge Instructions (Signed)
Apply ice to affected area for 20-30 minutes several times per day. Splint chest wall with pillow and perform deep breathing every few hours. Reserve vicodin for pain not controlled with the NSAID.

## 2016-12-14 NOTE — ED Notes (Signed)
Patient transported to X-ray 

## 2016-12-14 NOTE — ED Triage Notes (Signed)
Pt states he fell against an air conditioning unite-pain to right rib area-NAD-steady gait

## 2017-01-18 ENCOUNTER — Encounter (HOSPITAL_BASED_OUTPATIENT_CLINIC_OR_DEPARTMENT_OTHER): Payer: Self-pay | Admitting: *Deleted

## 2017-01-18 ENCOUNTER — Inpatient Hospital Stay (HOSPITAL_BASED_OUTPATIENT_CLINIC_OR_DEPARTMENT_OTHER)
Admission: EM | Admit: 2017-01-18 | Discharge: 2017-01-21 | DRG: 871 | Disposition: A | Discharge status: Home / Self Care | Payer: Medicare HMO | Attending: Family Medicine | Admitting: Family Medicine

## 2017-01-18 ENCOUNTER — Emergency Department (HOSPITAL_BASED_OUTPATIENT_CLINIC_OR_DEPARTMENT_OTHER): Payer: Medicare HMO

## 2017-01-18 ENCOUNTER — Other Ambulatory Visit: Payer: Self-pay

## 2017-01-18 DIAGNOSIS — E871 Hypo-osmolality and hyponatremia: Secondary | ICD-10-CM | POA: Diagnosis not present

## 2017-01-18 DIAGNOSIS — N289 Disorder of kidney and ureter, unspecified: Secondary | ICD-10-CM | POA: Diagnosis not present

## 2017-01-18 DIAGNOSIS — J189 Pneumonia, unspecified organism: Secondary | ICD-10-CM

## 2017-01-18 DIAGNOSIS — E119 Type 2 diabetes mellitus without complications: Secondary | ICD-10-CM | POA: Diagnosis not present

## 2017-01-18 DIAGNOSIS — G40909 Epilepsy, unspecified, not intractable, without status epilepticus: Secondary | ICD-10-CM | POA: Diagnosis present

## 2017-01-18 DIAGNOSIS — J440 Chronic obstructive pulmonary disease with acute lower respiratory infection: Secondary | ICD-10-CM | POA: Diagnosis not present

## 2017-01-18 DIAGNOSIS — M549 Dorsalgia, unspecified: Secondary | ICD-10-CM | POA: Diagnosis present

## 2017-01-18 DIAGNOSIS — G8929 Other chronic pain: Secondary | ICD-10-CM | POA: Diagnosis not present

## 2017-01-18 DIAGNOSIS — R05 Cough: Secondary | ICD-10-CM | POA: Diagnosis not present

## 2017-01-18 DIAGNOSIS — Z79899 Other long term (current) drug therapy: Secondary | ICD-10-CM | POA: Diagnosis not present

## 2017-01-18 DIAGNOSIS — E1110 Type 2 diabetes mellitus with ketoacidosis without coma: Secondary | ICD-10-CM | POA: Diagnosis present

## 2017-01-18 DIAGNOSIS — E861 Hypovolemia: Secondary | ICD-10-CM | POA: Diagnosis not present

## 2017-01-18 DIAGNOSIS — Z87891 Personal history of nicotine dependence: Secondary | ICD-10-CM

## 2017-01-18 DIAGNOSIS — J449 Chronic obstructive pulmonary disease, unspecified: Secondary | ICD-10-CM

## 2017-01-18 DIAGNOSIS — A419 Sepsis, unspecified organism: Secondary | ICD-10-CM | POA: Diagnosis not present

## 2017-01-18 DIAGNOSIS — Z7984 Long term (current) use of oral hypoglycemic drugs: Secondary | ICD-10-CM | POA: Diagnosis not present

## 2017-01-18 LAB — I-STAT CG4 LACTIC ACID, ED
LACTIC ACID, VENOUS: 2.66 mmol/L — AB (ref 0.5–1.9)
Lactic Acid, Venous: 4.73 mmol/L (ref 0.5–1.9)
Lactic Acid, Venous: 3.54 mmol/L (ref 0.5–1.9)
Lactic Acid, Venous: 2.24 mmol/L (ref 0.5–1.9)

## 2017-01-18 LAB — CBC WITH DIFFERENTIAL/PLATELET
BAND NEUTROPHILS: 34 %
Basophils Absolute: 0.1 10*3/uL (ref 0.0–0.1)
Basophils Relative: 1 %
Eosinophils Absolute: 0 10*3/uL (ref 0.0–0.7)
Eosinophils Relative: 0 %
HEMATOCRIT: 42.1 % (ref 39.0–52.0)
HEMOGLOBIN: 14.9 g/dL (ref 13.0–17.0)
Lymphocytes Relative: 12 %
Lymphs Abs: 1.5 10*3/uL (ref 0.7–4.0)
MCH: 29.6 pg (ref 26.0–34.0)
MCHC: 35.4 g/dL (ref 30.0–36.0)
MCV: 83.7 fL (ref 78.0–100.0)
MONOS PCT: 6 %
Metamyelocytes Relative: 4 %
Monocytes Absolute: 0.7 10*3/uL (ref 0.1–1.0)
Neutro Abs: 9.9 10*3/uL — ABNORMAL HIGH (ref 1.7–7.7)
Neutrophils Relative %: 43 %
PLATELETS: 211 10*3/uL (ref 150–400)
RBC: 5.03 MIL/uL (ref 4.22–5.81)
RDW: 12.9 % (ref 11.5–15.5)
WBC: 12.2 10*3/uL — ABNORMAL HIGH (ref 4.0–10.5)

## 2017-01-18 LAB — BASIC METABOLIC PANEL
Anion gap: 14 (ref 5–15)
BUN: 27 mg/dL — AB (ref 6–20)
CHLORIDE: 91 mmol/L — AB (ref 101–111)
CO2: 21 mmol/L — ABNORMAL LOW (ref 22–32)
Calcium: 8.4 mg/dL — ABNORMAL LOW (ref 8.9–10.3)
Creatinine, Ser: 1.69 mg/dL — ABNORMAL HIGH (ref 0.61–1.24)
GFR calc Af Amer: 49 mL/min — ABNORMAL LOW (ref >60)
GFR, EST NON AFRICAN AMERICAN: 42 mL/min — AB (ref >60)
GLUCOSE: 507 mg/dL — AB (ref 65–99)
Potassium: 3.7 mmol/L (ref 3.5–5.1)
Sodium: 126 mmol/L — ABNORMAL LOW (ref 135–145)

## 2017-01-18 LAB — URINALYSIS, ROUTINE W REFLEX MICROSCOPIC
Bilirubin Urine: NEGATIVE
Glucose, UA: >=500 mg/dL — AB
Hgb urine dipstick: NEGATIVE
Ketones, ur: 15 mg/dL — AB
Leukocytes, UA: NEGATIVE
Nitrite: NEGATIVE
Protein, ur: NEGATIVE mg/dL
Specific Gravity, Urine: 1.025 (ref 1.005–1.030)
pH: 6 (ref 5.0–8.0)

## 2017-01-18 LAB — CBG MONITORING, ED
GLUCOSE-CAPILLARY: 332 mg/dL — AB (ref 65–99)
GLUCOSE-CAPILLARY: 144 mg/dL — AB (ref 65–99)
Glucose-Capillary: 427 mg/dL — ABNORMAL HIGH (ref 65–99)
Glucose-Capillary: 405 mg/dL — ABNORMAL HIGH (ref 65–99)
Glucose-Capillary: 221 mg/dL — ABNORMAL HIGH (ref 65–99)
Glucose-Capillary: 217 mg/dL — ABNORMAL HIGH (ref 65–99)
Glucose-Capillary: 267 mg/dL — ABNORMAL HIGH (ref 65–99)

## 2017-01-18 LAB — URINALYSIS, MICROSCOPIC (REFLEX): WBC, UA: NONE SEEN WBC/hpf (ref 0–5)

## 2017-01-18 LAB — TROPONIN I: Troponin I: <0.03 ng/mL (ref <0.03)

## 2017-01-18 MED ORDER — SODIUM CHLORIDE 0.9 % IV BOLUS (SEPSIS)
1000.0000 mL | Freq: Once | INTRAVENOUS | Status: AC
  Administered 2017-01-18: 1000 mL via INTRAVENOUS

## 2017-01-18 MED ORDER — DEXTROSE 5 % IV SOLN
1.0000 g | Freq: Once | INTRAVENOUS | Status: AC
  Administered 2017-01-18: 1 g via INTRAVENOUS
  Filled 2017-01-18: qty 10

## 2017-01-18 MED ORDER — AZITHROMYCIN 250 MG PO TABS: 1000.0000 mg | ORAL_TABLET | Freq: Once | ORAL | Status: DC

## 2017-01-18 MED ORDER — DEXTROSE-NACL 5-0.45 % IV SOLN
INTRAVENOUS | Status: DC
  Administered 2017-01-18: 19:00:00 via INTRAVENOUS

## 2017-01-18 MED ORDER — CEFTRIAXONE SODIUM 250 MG IJ SOLR: 250.0000 mg | Freq: Once | INTRAMUSCULAR | Status: DC

## 2017-01-18 MED ORDER — AZITHROMYCIN 500 MG IV SOLR
INTRAVENOUS | Status: AC
  Filled 2017-01-18: qty 500

## 2017-01-18 MED ORDER — AZITHROMYCIN 500 MG IV SOLR
500.0000 mg | INTRAVENOUS | Status: DC
  Administered 2017-01-19 – 2017-01-20 (×2): 500 mg via INTRAVENOUS
  Filled 2017-01-18 (×4): qty 500

## 2017-01-18 MED ORDER — CEFTRIAXONE SODIUM 1 G IJ SOLR
1.0000 g | INTRAMUSCULAR | Status: DC
  Administered 2017-01-19 – 2017-01-20 (×2): 1 g via INTRAVENOUS
  Filled 2017-01-18 (×3): qty 10

## 2017-01-18 MED ORDER — ACETAMINOPHEN 500 MG PO TABS
1000.0000 mg | ORAL_TABLET | Freq: Once | ORAL | Status: AC
  Administered 2017-01-18: 1000 mg via ORAL
  Filled 2017-01-18: qty 2

## 2017-01-18 MED ORDER — SODIUM CHLORIDE 0.9 % IV SOLN
INTRAVENOUS | Status: DC
  Administered 2017-01-18: 3.7 [IU]/h via INTRAVENOUS
  Filled 2017-01-18 (×2): qty 1

## 2017-01-18 MED ORDER — IPRATROPIUM-ALBUTEROL 0.5-2.5 (3) MG/3ML IN SOLN
3.0000 mL | Freq: Four times a day (QID) | RESPIRATORY_TRACT | Status: DC
  Administered 2017-01-18 – 2017-01-19 (×2): 3 mL via RESPIRATORY_TRACT
  Filled 2017-01-18 (×2): qty 3

## 2017-01-18 MED ORDER — ALBUTEROL SULFATE (2.5 MG/3ML) 0.083% IN NEBU
5.0000 mg | INHALATION_SOLUTION | Freq: Once | RESPIRATORY_TRACT | Status: AC
  Administered 2017-01-18: 5 mg via RESPIRATORY_TRACT
  Filled 2017-01-18: qty 6

## 2017-01-18 MED ORDER — FENTANYL CITRATE (PF) 100 MCG/2ML IJ SOLN
50.0000 ug | Freq: Once | INTRAMUSCULAR | Status: AC
  Administered 2017-01-18: 50 ug via INTRAVENOUS
  Filled 2017-01-18: qty 2

## 2017-01-18 MED ORDER — DEXTROSE 5 % IV SOLN
500.0000 mg | Freq: Once | INTRAVENOUS | Status: AC
  Administered 2017-01-18: 500 mg via INTRAVENOUS
  Filled 2017-01-18: qty 500

## 2017-01-18 NOTE — ED Notes (Signed)
EDP notified of pt's blood pressure, nurse started IV fluid bolus

## 2017-01-18 NOTE — Progress Notes (Signed)
Called by Ms. Law with request for admission to [facility].  61yom with SOB.  Tachycardic and tachypneic in 30s. Normotensive.   CXR suggests pneumonia. Lactic acid above 4. Treated with 30cc/kg, ceftriaxone and azithromycin.  Admit to SDU for pneumonia, sepsis.  Brendia Sacksaniel Rhylie Stehr, MD [facility]

## 2017-01-18 NOTE — ED Provider Notes (Signed)
[facility] EMERGENCY DEPARTMENT Provider Note   CSN: [id number] Arrival date & time: 01/18/17  1417     History   Chief Complaint Chief Complaint  Patient presents with  . Cough    HPI Carlos Shelton is a 61 y.o. male with history of type 2 diabetes, COPD who presents with a 10-day history of cough, shortness of breath, and 1 day history of chest pain on breathing and coughing.  Patient reports he has had sweats at night for the past couple nights.  He is unaware of known fevers at home.  He has had nausea and vomiting, as well as diarrhea.  He denies any bloody stools.  He denies any abdominal pain.  He is not taking any medications at home for symptoms.  He has been out of his glipizide for type 2 diabetes for the past 3 days.  He has not seen his primary care provider or any other provider for symptoms since onset.  HPI  Past Medical History:  Diagnosis Date  . Bronchitis   . Chronic back pain   . COPD (chronic obstructive pulmonary disease) (HCC)   . Diabetes mellitus    type 2  . Epilepsy (HCC)   . Migraine   . Shingles   . Wears glasses     Patient Active Problem List   Diagnosis Date Noted  . Sepsis (HCC) 01/18/2017    Past Surgical History:  Procedure Laterality Date  . LAPAROSCOPIC CHOLECYSTECTOMY  05/18/10   with umbilical hernia repair  . MANDIBLE SURGERY    . TONSILLECTOMY AND ADENOIDECTOMY         Home Medications    Prior to Admission medications   Medication Sig Start Date End Date Taking? Authorizing Provider  GLIPIZIDE PO Take by mouth.    [provider]  HYDROcodone-acetaminophen (NORCO/VICODIN) 5-325 MG tablet Take 1 tablet every 6 (six) hours as needed by mouth for severe pain. 12/14/16   Felicie Morn, NP  naproxen (NAPROSYN) 375 MG tablet Take 1 tablet (375 mg total) 2 (two) times daily by mouth. 12/14/16   Felicie Morn, NP    Family History Family History  Problem Relation Age of Onset  . Breast cancer Mother    . Diabetes Mother   . Heart disease Father   . COPD Brother   . Cancer Sister     Social History Social History   Tobacco Use  . Smoking status: Former Smoker    Packs/day: 0.50  . Smokeless tobacco: Never Used  Substance Use Topics  . Alcohol use: No  . Drug use: No     Allergies   Patient has no known allergies.   Review of Systems Review of Systems  Constitutional: Positive for chills and fever.  HENT: Negative for facial swelling and sore throat.   Respiratory: Positive for cough and shortness of breath.   Cardiovascular: Positive for chest pain.  Gastrointestinal: Positive for diarrhea, nausea and vomiting. Negative for abdominal pain.  Genitourinary: Negative for dysuria.  Musculoskeletal: Negative for back pain.  Skin: Negative for rash and wound.  Neurological: Negative for headaches.  Psychiatric/Behavioral: The patient is not nervous/anxious.      Physical Exam Updated Vital Signs BP 117/71   Pulse (!) 123   Temp 97.6 F (36.4 C) (Oral)   Resp (!) 30   Ht 5\' 11"  (1.803 m)   Wt 63.5 kg (140 lb)   SpO2 94%   BMI 19.53 kg/m   Physical Exam  Constitutional:  He appears well-developed and well-nourished. No distress.  HENT:  Head: Normocephalic and atraumatic.  Mouth/Throat: Oropharynx is clear and moist. No oropharyngeal exudate.  Eyes: Conjunctivae are normal. Pupils are equal, round, and reactive to light. Right eye exhibits no discharge. Left eye exhibits no discharge. No scleral icterus.  Neck: Normal range of motion. Neck supple. No thyromegaly present.  Cardiovascular: Normal rate, regular rhythm, normal heart sounds and intact distal pulses. Exam reveals no gallop and no friction rub.  No murmur heard. Pulmonary/Chest: Effort normal. No stridor. No respiratory distress. He has decreased breath sounds (L base, especially). He has no wheezes. He has no rales.  Abdominal: Soft. Bowel sounds are normal. He exhibits no distension. There is no  tenderness. There is no rebound and no guarding.  Musculoskeletal: He exhibits no edema.  Lymphadenopathy:    He has no cervical adenopathy.  Neurological: He is alert. Coordination normal.  Skin: Skin is warm and dry. No rash noted. He is not diaphoretic. No pallor.  Psychiatric: He has a normal mood and affect.  Nursing note and vitals reviewed.    ED Treatments / Results  Labs (all labs ordered are listed, but only abnormal results are displayed) Labs Reviewed  CBC WITH DIFFERENTIAL/PLATELET - Abnormal; Notable for the following components:      Result Value   WBC 12.2 (*)    Neutro Abs 9.9 (*)    All other components within normal limits  BASIC METABOLIC PANEL - Abnormal; Notable for the following components:   Sodium 126 (*)    Chloride 91 (*)    CO2 21 (*)    Glucose, Bld 507 (*)    BUN 27 (*)    Creatinine, Ser 1.69 (*)    Calcium 8.4 (*)    GFR calc non Af Amer 42 (*)    GFR calc Af Amer 49 (*)    All other components within normal limits  I-STAT CG4 LACTIC ACID, ED - Abnormal; Notable for the following components:   Lactic Acid, Venous 4.73 (*)    All other components within normal limits  CBG MONITORING, ED - Abnormal; Notable for the following components:   Glucose-Capillary 427 (*)    All other components within normal limits  CBG MONITORING, ED - Abnormal; Notable for the following components:   Glucose-Capillary 405 (*)    All other components within normal limits  CULTURE, BLOOD (ROUTINE X 2)  CULTURE, BLOOD (ROUTINE X 2)  TROPONIN I  URINALYSIS, ROUTINE W REFLEX MICROSCOPIC  PATHOLOGIST SMEAR REVIEW  I-STAT CG4 LACTIC ACID, ED    EKG  EKG Interpretation None       Radiology Dg Chest 2 View  Result Date: 01/18/2017 CLINICAL DATA:  Cough and congestion. EXAM: CHEST  2 VIEW COMPARISON:  12/14/2016.  11/08/2014. FINDINGS: Mediastinum hilar structures normal. Is stable elevation left hemidiaphragm. Mild bibasilar atelectasis/ infiltrates. No  prominent pleural effusion or pneumothorax. Heart size stable. No acute bony abnormality. Surgical clips right upper quadrant. IMPRESSION: Stable elevation left hemidiaphragm. Mild bibasilar atelectasis/infiltrates. Infiltrates best identified on lateral view. Follow-up chest x-rays demonstrate clearing suggested. Electronically Signed   By: Maisie Fushomas  Register   On: 01/18/2017 15:06    Procedures Procedures (including critical care time)  Sepsis - Repeat Assessment  Performed at:    1550  Vitals     Blood pressure 119/75, pulse (!) 128, temperature 97.6 F (36.4 C), temperature source Oral, resp. rate (!) 36, height 5\' 11"  (1.803 m), weight 63.5 kg (140 lb),  SpO2 100 %.  Heart:     Irregular rate and rhythm and Tachycardic  Lungs:    Rhonchi  Capillary Refill:   <2 sec  Peripheral Pulse:   Radial pulse palpable  Skin:     Pale  CRITICAL CARE Performed by: Emi Holes   Total critical care time: 30 minutes  Critical care time was exclusive of separately billable procedures and treating other patients.  Critical care was necessary to treat or prevent imminent or life-threatening deterioration.  Critical care was time spent personally by me on the following activities: development of treatment plan with patient and/or surrogate as well as nursing, discussions with consultants, evaluation of patient's response to treatment, examination of patient, obtaining history from patient or surrogate, ordering and performing treatments and interventions, ordering and review of laboratory studies, ordering and review of radiographic studies, pulse oximetry and re-evaluation of patient's condition.    Medications Ordered in ED Medications  ipratropium-albuterol (DUONEB) 0.5-2.5 (3) MG/3ML nebulizer solution 3 mL (not administered)  cefTRIAXone (ROCEPHIN) 1 g in dextrose 5 % 50 mL IVPB (not administered)  azithromycin (ZITHROMAX) 500 mg in dextrose 5 % 250 mL IVPB (not administered)    azithromycin (ZITHROMAX) 500 MG injection (not administered)  dextrose 5 %-0.45 % sodium chloride infusion (not administered)  insulin regular (NOVOLIN R,HUMULIN R) 100 Units in sodium chloride 0.9 % 100 mL (1 Units/mL) infusion (6.9 Units/hr Intravenous Rate/Dose Change 01/18/17 1645)  albuterol (PROVENTIL) (2.5 MG/3ML) 0.083% nebulizer solution 5 mg (5 mg Nebulization Given 01/18/17 1437)  cefTRIAXone (ROCEPHIN) 1 g in dextrose 5 % 50 mL IVPB (0 g Intravenous Stopped 01/18/17 1627)  azithromycin (ZITHROMAX) 500 mg in dextrose 5 % 250 mL IVPB (0 mg Intravenous Stopped 01/18/17 1627)  sodium chloride 0.9 % bolus 1,000 mL (0 mLs Intravenous Stopped 01/18/17 1627)    And  sodium chloride 0.9 % bolus 1,000 mL (0 mLs Intravenous Stopped 01/18/17 1627)  fentaNYL (SUBLIMAZE) injection 50 mcg (50 mcg Intravenous Given 01/18/17 1611)     Initial Impression / Assessment and Plan / ED Course  I have reviewed the triage vital signs and the nursing notes.  Pertinent labs & imaging results that were available during my care of the patient were reviewed by me and considered in my medical decision making (see chart for details).  Clinical Course as of Jan 18 1645  Tue Jan 18, 2017  1444 Considering tachycardia, tachypnea, code sepsis called.  Rocephin and azithromycin initiated for suspected CAP.  [AL]    Clinical Course User Index [AL] Emi Holes, PA-C    Patient with probable sepsis pneumonia.  Patient is tachycardic and tachypneic.  Code sepsis called.  Lactic 4.73.  Blood cultures pending.  White count 12K.  CXR shows [Stable elevation left hemidiaphragm. Mild bibasilar atelectasis/infiltrates. Infiltrates best identified on lateral view.]  Rocephin, azithromycin, and weightbase fluids initiated in the ED.  Troponin is negative.  Blood glucose 507.  Glucostabilizer initiated.  No DKA at this time as anion gap within normal limits and CO2 21.  Patient does look to have an AKI with creatinine  1.69, however most recent creatinine for the patient is from 2012.  Patient given fentanyl for pain control.  Patient's breathing did not seem to improve after DuoNeb.  On auscultation, wheezing still not present.  Patient's work of breathing stable and patient is able to speak clearly without interruptions.  Do not feel BiPAP is necessary at this time.  I consulted [facility] and spoke  with Dr. Irene LimboGoodrich who will admit the patient to the stepdown unit.  Patient also evaluated by Dr. Jacqulyn BathLong who guided the patient's management and agrees with plan.  Final Clinical Impressions(s) / ED Diagnoses   Final diagnoses:  Community acquired pneumonia, unspecified laterality  Sepsis, due to unspecified organism [facility](HCC)    ED Discharge Orders    None       Emi HolesLaw, Ashana Tullo M, PA-C 01/18/17 1646    Maia PlanLong, Joshua G, MD 01/18/17 [phone]

## 2017-01-18 NOTE — ED Notes (Addendum)
EDP notified of pt's general sick appearance, as well as bloody sputum, no new orders at this time

## 2017-01-18 NOTE — Progress Notes (Signed)
RT reported panic Latic acid result 4.73 to DR Long.

## 2017-01-18 NOTE — Progress Notes (Signed)
RT reported Lactic acid 3.54 to DR Adela LankFloyd

## 2017-01-18 NOTE — ED Notes (Signed)
Dr. Adela LankFloyd aware of pt's FSBG trends

## 2017-01-18 NOTE — ED Notes (Signed)
Per lab  2nd set blood cultures were not received   Pt had received antibiotics at 1515 so 2 nd set of BC was dc"d

## 2017-01-18 NOTE — ED Triage Notes (Signed)
Cough x 10 days. SOB. Chest pain. Pale. Labored breathing.

## 2017-01-18 NOTE — ED Provider Notes (Signed)
61 yo M with a chief complaint of cough shortness of breath chest pain.  This been going on for the past week.  He thought he had a cold and thought it would get better but it has not.  He was significantly hyperglycemic when he arrived which has just about resolved after an insulin drip.  The drip was taken off.  The patient had also had an elevated lactate that has almost cleared at this point.  The patient has been boarding in the ED because there is no bed available.  I was called to the bedside because of a low blood pressure.  On my exam the patient stated that he did not feel well..  Pale and cachectic.  No noted rales.  Slightly diminished breath sounds in bilateral bases.  No peripheral edema.  No abdominal tenderness.  Performed a bedside ultrasound with a dilated IVC that does not respond to respiratory variation.  He had a hyperdynamic heart.  While was in the room the patient's blood pressure spontaneously improved.  We will give him 1 L of fluids and reassess.  If the patient continues to have a low blood pressure I would likely start pressors.  Good response to fluids thus far.  Has gotten a bed, going to step down.     EMERGENCY DEPARTMENT US CARDIAC EXAM "Study: Limited Ultrasound of the Heart and Pericardium"  INDICATIONS:Abnormal vital signs Multiple views of the heart and pericardium were obtained in real-time with a multi-frequency probe.  PERFORMED UJ:[id number]:Myself IMAGES ARCHIVED?: Yes LIMITATIONS:  Body habitus and Emergent procedure VIEWS USED: Subcostal 4 chamber and Inferior Vena Cava INTERPRETATION: Cardiac activity present, Pericardial effusioin absent, Cardiac tamponade absent, Probable elevated CVP, Increased contractility and IVC dilated     Melene PlanFloyd, Airanna Partin, DO 01/18/17 2320

## 2017-01-19 ENCOUNTER — Encounter (HOSPITAL_COMMUNITY): Payer: Self-pay | Admitting: Family Medicine

## 2017-01-19 DIAGNOSIS — G8929 Other chronic pain: Secondary | ICD-10-CM | POA: Diagnosis present

## 2017-01-19 DIAGNOSIS — M549 Dorsalgia, unspecified: Secondary | ICD-10-CM

## 2017-01-19 DIAGNOSIS — E119 Type 2 diabetes mellitus without complications: Secondary | ICD-10-CM

## 2017-01-19 DIAGNOSIS — E871 Hypo-osmolality and hyponatremia: Secondary | ICD-10-CM | POA: Diagnosis present

## 2017-01-19 DIAGNOSIS — E1110 Type 2 diabetes mellitus with ketoacidosis without coma: Secondary | ICD-10-CM | POA: Diagnosis present

## 2017-01-19 DIAGNOSIS — N289 Disorder of kidney and ureter, unspecified: Secondary | ICD-10-CM

## 2017-01-19 DIAGNOSIS — J449 Chronic obstructive pulmonary disease, unspecified: Secondary | ICD-10-CM | POA: Diagnosis present

## 2017-01-19 LAB — COMPREHENSIVE METABOLIC PANEL
ALT: 15 U/L — AB (ref 17–63)
AST: 20 U/L (ref 15–41)
Albumin: 2.4 g/dL — ABNORMAL LOW (ref 3.5–5.0)
Alkaline Phosphatase: 65 U/L (ref 38–126)
Anion gap: 11 (ref 5–15)
BUN: 27 mg/dL — AB (ref 6–20)
CHLORIDE: 98 mmol/L — AB (ref 101–111)
CO2: 21 mmol/L — AB (ref 22–32)
CREATININE: 1.42 mg/dL — AB (ref 0.61–1.24)
Calcium: 7.9 mg/dL — ABNORMAL LOW (ref 8.9–10.3)
GFR calc Af Amer: >60 mL/min (ref >60)
GFR calc non Af Amer: 52 mL/min — ABNORMAL LOW (ref >60)
Glucose, Bld: 324 mg/dL — ABNORMAL HIGH (ref 65–99)
Potassium: 3.4 mmol/L — ABNORMAL LOW (ref 3.5–5.1)
SODIUM: 130 mmol/L — AB (ref 135–145)
Total Bilirubin: 0.9 mg/dL (ref 0.3–1.2)
Total Protein: 5.7 g/dL — ABNORMAL LOW (ref 6.5–8.1)

## 2017-01-19 LAB — EXPECTORATED SPUTUM ASSESSMENT W GRAM STAIN, RFLX TO RESP C

## 2017-01-19 LAB — GLUCOSE, CAPILLARY
Glucose-Capillary: 353 mg/dL — ABNORMAL HIGH (ref 65–99)
Glucose-Capillary: 356 mg/dL — ABNORMAL HIGH (ref 65–99)
Glucose-Capillary: 303 mg/dL — ABNORMAL HIGH (ref 65–99)
Glucose-Capillary: 248 mg/dL — ABNORMAL HIGH (ref 65–99)
Glucose-Capillary: 234 mg/dL — ABNORMAL HIGH (ref 65–99)

## 2017-01-19 LAB — LACTIC ACID, PLASMA
LACTIC ACID, VENOUS: 1.9 mmol/L (ref 0.5–1.9)
Lactic Acid, Venous: 2.7 mmol/L (ref 0.5–1.9)

## 2017-01-19 LAB — HIV ANTIBODY (ROUTINE TESTING W REFLEX): HIV SCREEN 4TH GENERATION: NONREACTIVE

## 2017-01-19 LAB — MRSA PCR SCREENING: MRSA by PCR: NEGATIVE

## 2017-01-19 LAB — STREP PNEUMONIAE URINARY ANTIGEN: Strep Pneumo Urinary Antigen: POSITIVE — AB

## 2017-01-19 LAB — EXPECTORATED SPUTUM ASSESSMENT W REFEX TO RESP CULTURE

## 2017-01-19 LAB — PATHOLOGIST SMEAR REVIEW

## 2017-01-19 MED ORDER — HYDROCODONE-ACETAMINOPHEN 5-325 MG PO TABS
1.0000 | ORAL_TABLET | Freq: Four times a day (QID) | ORAL | Status: DC | PRN
  Administered 2017-01-19 – 2017-01-20 (×2): 1 via ORAL
  Administered 2017-01-21: 2 via ORAL
  Administered 2017-01-21: 1 via ORAL
  Filled 2017-01-19 (×2): qty 1
  Filled 2017-01-19: qty 2
  Filled 2017-01-19: qty 1

## 2017-01-19 MED ORDER — IPRATROPIUM-ALBUTEROL 0.5-2.5 (3) MG/3ML IN SOLN
3.0000 mL | RESPIRATORY_TRACT | Status: DC | PRN
Start: 2017-01-19 — End: 2017-01-21
  Administered 2017-01-20: 3 mL via RESPIRATORY_TRACT
  Filled 2017-01-19: qty 3

## 2017-01-19 MED ORDER — SODIUM CHLORIDE 0.9 % IV SOLN
INTRAVENOUS | Status: AC
  Administered 2017-01-19 (×2): via INTRAVENOUS

## 2017-01-19 MED ORDER — HEPARIN SODIUM (PORCINE) 5000 UNIT/ML IJ SOLN
5000.0000 [IU] | Freq: Three times a day (TID) | INTRAMUSCULAR | Status: DC
  Administered 2017-01-19 – 2017-01-21 (×7): 5000 [IU] via SUBCUTANEOUS
  Filled 2017-01-19 (×7): qty 1

## 2017-01-19 MED ORDER — ACETAMINOPHEN 325 MG PO TABS: 650.0000 mg | ORAL_TABLET | Freq: Four times a day (QID) | ORAL | Status: DC | PRN

## 2017-01-19 MED ORDER — DEXTROMETHORPHAN POLISTIREX ER 30 MG/5ML PO SUER
30.0000 mg | Freq: Two times a day (BID) | ORAL | Status: DC
  Administered 2017-01-19 – 2017-01-21 (×5): 30 mg via ORAL
  Filled 2017-01-19 (×5): qty 5

## 2017-01-19 MED ORDER — SODIUM CHLORIDE 0.9 % IV BOLUS (SEPSIS)
1000.0000 mL | Freq: Once | INTRAVENOUS | Status: AC
  Administered 2017-01-19: 1000 mL via INTRAVENOUS

## 2017-01-19 MED ORDER — INSULIN ASPART 100 UNIT/ML ~~LOC~~ SOLN
0.0000 [IU] | Freq: Every day | SUBCUTANEOUS | Status: DC
  Administered 2017-01-19: 2 [IU] via SUBCUTANEOUS

## 2017-01-19 MED ORDER — INSULIN GLARGINE 100 UNIT/ML ~~LOC~~ SOLN
10.0000 [IU] | Freq: Every day | SUBCUTANEOUS | Status: DC
  Administered 2017-01-19 – 2017-01-21 (×3): 10 [IU] via SUBCUTANEOUS
  Filled 2017-01-19 (×3): qty 0.1

## 2017-01-19 MED ORDER — METHYLPREDNISOLONE SODIUM SUCC 40 MG IJ SOLR
40.0000 mg | Freq: Two times a day (BID) | INTRAMUSCULAR | Status: DC
  Administered 2017-01-19: 40 mg via INTRAVENOUS
  Filled 2017-01-19: qty 1

## 2017-01-19 MED ORDER — HYDROCOD POLST-CPM POLST ER 10-8 MG/5ML PO SUER
5.0000 mL | Freq: Two times a day (BID) | ORAL | Status: DC | PRN
  Administered 2017-01-19 – 2017-01-21 (×3): 5 mL via ORAL
  Filled 2017-01-19 (×3): qty 5

## 2017-01-19 MED ORDER — INSULIN ASPART 100 UNIT/ML ~~LOC~~ SOLN
0.0000 [IU] | Freq: Three times a day (TID) | SUBCUTANEOUS | Status: DC
  Administered 2017-01-19: 11 [IU] via SUBCUTANEOUS
  Administered 2017-01-19 (×2): 15 [IU] via SUBCUTANEOUS
  Administered 2017-01-20: 5 [IU] via SUBCUTANEOUS
  Administered 2017-01-20 – 2017-01-21 (×3): 3 [IU] via SUBCUTANEOUS

## 2017-01-19 NOTE — Progress Notes (Signed)
Patient admitted to room 3M05 from [facility].  Accompanied by [facility].  Patient transferred into bed with min assist.  CHG wipe down performed, skin assessment performed.  Admitting physician paged.  Oriented to room and call light, which is in his reach.  No signs or symptoms of pain or distress noted at this time.  Vital signs documented and stable. Will continue to monitor.

## 2017-01-19 NOTE — Progress Notes (Signed)
CRITICAL VALUE ALERT  Critical Value:  Lactic Acid  Date & Time Notied:  01/01/2017     [id number]  Provider Notified: Craige CottaKirby, NP  Orders Received/Actions taken: Bolus ordered

## 2017-01-19 NOTE — Progress Notes (Signed)
Pts K slightly low this AM at 3.4. Attending MD aware

## 2017-01-19 NOTE — Progress Notes (Addendum)
Inpatient Diabetes Program Recommendations  AACE/ADA: New Consensus Statement on Inpatient Glycemic Control (2015)  Target Ranges:  Prepandial:   less than 140 mg/dL      Peak postprandial:   less than 180 mg/dL (1-2 hours)      Critically ill patients:  140 - 180 mg/dL   Lab Results  Component Value Date   GLUCAP 353 (H) 01/19/2017    Review of Glycemic Control Results for Carlos Shelton, Carlos Shelton (MRN [id number]) as of 01/19/2017 12:05  Ref. Range 01/18/2017 21:33 01/18/2017 22:55 01/18/2017 23:00 01/18/2017 23:56 01/19/2017 02:28 01/19/2017 06:15 01/19/2017 08:02 01/19/2017 11:47  Glucose-Capillary Latest Ref Range: 65 - 99 mg/dL 409217 (H) 811267 (H)     914353 (H)    Diabetes history: Type 2 DM Outpatient Diabetes medications: Glipizide PO (not specified dosage) Current orders for Inpatient glycemic control: Novolog 0-5 HS, Novolog 0-15 Units TID  Inpatient Diabetes Program Recommendations:    Noted in the ED patient was on insulin drip and it was discontinued prior to transfer on the unit. Also of note, patient does not have updated A1C and received 1 dose of Solumedrol 40 mg IV. Will plan to touch base with this patient regarding home regimen, access to supplies, etc. At this time, I would recommend adding Novolog 3 Units meal coverage TID, and Lantus 12 Units HS.  Addendum: Spoke with patient regarding home diabetes management. Patient states, "He has had diabetes for 10 years and just quit taking medications and checking blood sugars." He voiced concerned about medications causing lows. May need to consider alternative regimen at discharge. He will need additional supplies to include: meter, and test strips. Lastly, we discussed any financial restrictions and it was clear that this was not the reason for his medication non-compliance. Patient has PCP that he plans to see for follow up after discharge.    Thanks,  Lujean RaveLauren Viviano Bir, MSN, RNC-OB Diabetes Coordinator [phone] (8a-5p)

## 2017-01-19 NOTE — Progress Notes (Signed)
I agree with HPI and A/p per Dr. Antionette Charpyd  s 61 ? dm ty ii, htn, prior lap chole 05/19/10 + hernia repair, R sided injury ~ 2008 after a fall---Seen in ED for pain in side on 12/14/16 and sent home.  subsequently developed cough  [facility] concerns for Sepsis 2/2 PNA-CXR suggest PNA, LActic acid >4--->2, Tachypnea 30-sodium 130. Creat 1.6 on admit up from prior 0.8   O/e disheveled in nad but cough ++ Somewhat productive Decreased fremitus abnd resonance L>R side tattoo on upper back abd soft nt nd  No le edema Neuro intact and moving 4 limbs smile symmetrical and no power deficits  A/ P Adding cough suppressant dextromethorphan given recent fall on R side and rib pain Cont broad IV ab Diabetic diet Creat down but still continue IVF for now as lactic acid was a little high  Expect can transfer off SDU in ~ 24 hrs   Pleas KochJai Guadalupe Nickless, MD [facility] [phone](P) [phone]

## 2017-01-19 NOTE — H&P (Signed)
History and Physical    Carlos Shelton [id number] DOB: Mar 10, 1955 DOA: 01/18/2017  PCP: Georgann HousekeeperHusain, Karrar, MD   [facility]  Chief Complaint: SOB, cough, fever  HPI: Carlos Shelton is a 61 y.o. male with medical history significant for type 2 diabetes mellitus and chronic back pain, now presenting to the emergency department for evaluation of cough, shortness of breath, and fevers with chills.  Patient reports that he been in his usual state of health until approximately 10 days ago when he developed a cough.  Since that time, his condition is progressively worsened and he has become increasingly short of breath.  He has also developed subjective fevers with chills.  He denies any recent travel or sick contacts, and denies any pain or tenderness in the lower extremity.  He has not attempted any interventions for the symptoms and has not been evaluated previously for this.  [facility] Course: Upon arrival to the ED, patient is found to be febrile to 38.4 degree C, saturating low 90s on room air, tachypneic in the mid 30s, tachycardic in the 120s, and with blood pressure of 90/65.  EKG features a sinus tachycardia with rate 120.  Chest x-ray is notable for bibasilar infiltrates.  Chemistry panel reveals a sodium of 126, glucose 507, and creatinine 1.69 with no recent priors available for comparison.  CBC is notable for leukocytosis to 12,200.  Troponin is undetectable.  Lactic acid is elevated to 4.73.  Urinalysis features glucosuria and ketonuria.  Blood cultures were collected, 2 L of normal saline was administered, patient was treated with empiric Rocephin and azithromycin, and started on insulin infusion.  DKA resolved, insulin infusion was discontinued, blood pressure dropped some, and he was given a 3rd L of normal saline.  Lactic acid improved, as stated breathing and heart rate, and the patient was accepted for admission to the stepdown unit  for ongoing evaluation and management of sepsis secondary to pneumonia.  Review of Systems:  All other systems reviewed and apart from HPI, are negative.  Past Medical History:  Diagnosis Date  . Bronchitis   . Chronic back pain   . COPD (chronic obstructive pulmonary disease) (HCC)   . Diabetes mellitus    type 2  . Epilepsy (HCC)   . Migraine   . Shingles   . Wears glasses     Past Surgical History:  Procedure Laterality Date  . LAPAROSCOPIC CHOLECYSTECTOMY  05/18/10   with umbilical hernia repair  . MANDIBLE SURGERY    . TONSILLECTOMY AND ADENOIDECTOMY       reports that he has quit smoking. He smoked 0.50 packs per day. he has never used smokeless tobacco. He reports that he does not drink alcohol or use drugs.  No Known Allergies  Family History  Problem Relation Age of Onset  . Breast cancer Mother   . Diabetes Mother   . Heart disease Father   . COPD Brother   . Cancer Sister      Prior to Admission medications   Medication Sig Start Date End Date Taking? Authorizing Provider  GLIPIZIDE PO Take by mouth.    [provider]  HYDROcodone-acetaminophen (NORCO/VICODIN) 5-325 MG tablet Take 1 tablet every 6 (six) hours as needed by mouth for severe pain. 12/14/16   Felicie MornSmith, David, NP  naproxen (NAPROSYN) 375 MG tablet Take 1 tablet (375 mg total) 2 (two) times daily by mouth. 12/14/16   Felicie MornSmith, David, NP  Physical Exam: Vitals:   01/18/17 2245 01/18/17 2259 01/19/17 0001 01/19/17 0133  BP: 100/63  92/66   Pulse: (!) 110  (!) 110 (!) 111  Resp: (!) 28  (!) 28 (!) 26  Temp:  98.8 F (37.1 C) 98 F (36.7 C)   TempSrc:  Oral Oral   SpO2: 99%  98% 95%  Weight:   67.8 kg (149 lb 7.6 oz)   Height:   5\' 11"  (1.803 m)       Constitutional: NAD, calm, in apparent discomfort Eyes: PERTLA, lids and conjunctivae normal ENMT: Mucous membranes are moist. Posterior pharynx clear of any exudate or lesions.   Neck: normal, supple, no masses, no  thyromegaly Respiratory: Rhonchi at bilateral lower and mid-lung zones, no wheezing, no crackles. Dyspneic with speech. No accessory muscle use.  Cardiovascular: Rate ~120 and regular. No extremity edema. No significant JVD. Abdomen: No distension, no tenderness, no masses palpated. Bowel sounds normal.  Musculoskeletal: no clubbing / cyanosis. No joint deformity upper and lower extremities.    Skin: no significant rashes, lesions, ulcers. Warm, dry, well-perfused. Neurologic: CN 2-12 grossly intact. Sensation intact. Strength 5/5 in all 4 limbs.  Psychiatric: Alert and oriented x 3. Calm, cooperative.     Labs on Admission: I have personally reviewed following labs and imaging studies  CBC: Recent Labs  Lab 01/18/17 1428  WBC 12.2*  NEUTROABS 9.9*  HGB 14.9  HCT 42.1  MCV 83.7  PLT 211   Basic Metabolic Panel: Recent Labs  Lab 01/18/17 1428  NA 126*  K 3.7  CL 91*  CO2 21*  GLUCOSE 507*  BUN 27*  CREATININE 1.69*  CALCIUM 8.4*   GFR: Estimated Creatinine Clearance: 44 mL/min (A) (by C-G formula based on SCr of 1.69 mg/dL (H)). Liver Function Tests: No results for input(s): AST, ALT, ALKPHOS, BILITOT, PROT, ALBUMIN in the last 168 hours. No results for input(s): LIPASE, AMYLASE in the last 168 hours. No results for input(s): AMMONIA in the last 168 hours. Coagulation Profile: No results for input(s): INR, PROTIME in the last 168 hours. Cardiac Enzymes: Recent Labs  Lab 01/18/17 1428  TROPONINI <0.03   BNP (last 3 results) No results for input(s): PROBNP in the last 8760 hours. HbA1C: No results for input(s): HGBA1C in the last 72 hours. CBG: Recent Labs  Lab 01/18/17 1744 01/18/17 1847 01/18/17 1959 01/18/17 2133 01/18/17 2255  GLUCAP 332* 221* 144* 217* 267*   Lipid Profile: No results for input(s): CHOL, HDL, LDLCALC, TRIG, CHOLHDL, LDLDIRECT in the last 72 hours. Thyroid Function Tests: No results for input(s): TSH, T4TOTAL, FREET4, T3FREE,  THYROIDAB in the last 72 hours. Anemia Panel: No results for input(s): VITAMINB12, FOLATE, FERRITIN, TIBC, IRON, RETICCTPCT in the last 72 hours. Urine analysis:    Component Value Date/Time   COLORURINE YELLOW 01/18/2017 2000   APPEARANCEUR CLEAR 01/18/2017 2000   LABSPEC 1.025 01/18/2017 2000   PHURINE 6.0 01/18/2017 2000   GLUCOSEU >=500 (A) 01/18/2017 2000   HGBUR NEGATIVE 01/18/2017 2000   BILIRUBINUR NEGATIVE 01/18/2017 2000   KETONESUR 15 (A) 01/18/2017 2000   PROTEINUR NEGATIVE 01/18/2017 2000   UROBILINOGEN 0.2 03/02/2010 1416   NITRITE NEGATIVE 01/18/2017 2000   LEUKOCYTESUR NEGATIVE 01/18/2017 2000   Sepsis Labs: @LABRCNTIP (procalcitonin:4,lacticidven:4) )No results found for this or any previous visit (from the past 240 hour(s)).   Radiological Exams on Admission: Dg Chest 2 View  Result Date: 01/18/2017 CLINICAL DATA:  Cough and congestion. EXAM: CHEST  2 VIEW COMPARISON:  12/14/2016.  11/08/2014. FINDINGS: Mediastinum hilar structures normal. Is stable elevation left hemidiaphragm. Mild bibasilar atelectasis/ infiltrates. No prominent pleural effusion or pneumothorax. Heart size stable. No acute bony abnormality. Surgical clips right upper quadrant. IMPRESSION: Stable elevation left hemidiaphragm. Mild bibasilar atelectasis/infiltrates. Infiltrates best identified on lateral view. Follow-up chest x-rays demonstrate clearing suggested. Electronically Signed   By: Maisie Fus  Register   On: 01/18/2017 15:06    EKG: Independently reviewed. Sinus tachycardia (120).   Assessment/Plan  1. Sepsis secondary to PNA  - Pt presents with cough, fever, SOB; found to have leukocytosis, elevated lactate, and bibasilar infiltrates  - Cultures were collected in ED, 30 cc/kg NS bolus given, and empiric Rocephin and azithromycin was given  - Check sputum culture and strep pneumo antigen, continue Rocephin and azithromycin, continue supportive care with prn supplemental O2 and nebs    2.  COPD  - No wheezing noted  - Continue prn nebs   3. Type II DM; DKA  - No A1c on file  - Presented in mild DKA and was treated in ED with insulin infusion, discontinued prior to transfer  - Repeat chem panel now, continue CBG's, start SSI with Novolog    4. Hyponatremia  - Serum sodium is 126 in setting of hypovolemia and marked hyperglycemia  - He was fluid-resuscitated in ED and glycemic-control was achieved  - Repeat chem panel, continue NS infusion    5. Kidney disease of uncertain chronicity - SCr is 1.69 on admission with no recent priors available  - He is hypovolemic and a prerenal azotemia possible  - He was treated with 3 liters NS in ED  - Renally-dose medications, continue IVF hydration, repeat chem panel    DVT prophylaxis: sq heparin  Code Status: Full  Family Communication: Discussed with patient Disposition Plan: Admit to SDU Consults called: None Admission status: Inpatient    Briscoe Deutscher, MD [facility] Pager [phone]  If 7PM-7AM, please contact night-coverage [email or web address] Password [email or web address]  01/19/2017, 2:45 AM

## 2017-01-20 LAB — CBC WITH DIFFERENTIAL/PLATELET
BASOS PCT: 0 %
Basophils Absolute: 0 10*3/uL (ref 0.0–0.1)
EOS ABS: 0 10*3/uL (ref 0.0–0.7)
Eosinophils Relative: 0 %
HCT: 33.1 % — ABNORMAL LOW (ref 39.0–52.0)
HEMOGLOBIN: 11.4 g/dL — AB (ref 13.0–17.0)
Lymphocytes Relative: 11 %
Lymphs Abs: 1.5 10*3/uL (ref 0.7–4.0)
MCH: 29.2 pg (ref 26.0–34.0)
MCHC: 34.4 g/dL (ref 30.0–36.0)
MCV: 84.9 fL (ref 78.0–100.0)
MONOS PCT: 6 %
Monocytes Absolute: 0.8 10*3/uL (ref 0.1–1.0)
NEUTROS PCT: 83 %
Neutro Abs: 11 10*3/uL — ABNORMAL HIGH (ref 1.7–7.7)
PLATELETS: 192 10*3/uL (ref 150–400)
RBC: 3.9 MIL/uL — AB (ref 4.22–5.81)
RDW: 13.3 % (ref 11.5–15.5)
WBC: 13.4 10*3/uL — AB (ref 4.0–10.5)

## 2017-01-20 LAB — GLUCOSE, CAPILLARY
GLUCOSE-CAPILLARY: 150 mg/dL — AB (ref 65–99)
Glucose-Capillary: 156 mg/dL — ABNORMAL HIGH (ref 65–99)
Glucose-Capillary: 119 mg/dL — ABNORMAL HIGH (ref 65–99)
Glucose-Capillary: 239 mg/dL — ABNORMAL HIGH (ref 65–99)

## 2017-01-20 LAB — COMPREHENSIVE METABOLIC PANEL
ALBUMIN: 2.3 g/dL — AB (ref 3.5–5.0)
ALK PHOS: 72 U/L (ref 38–126)
ALT: 15 U/L — ABNORMAL LOW (ref 17–63)
ANION GAP: 6 (ref 5–15)
AST: 22 U/L (ref 15–41)
BUN: 19 mg/dL (ref 6–20)
CALCIUM: 7.9 mg/dL — AB (ref 8.9–10.3)
CHLORIDE: 103 mmol/L (ref 101–111)
CO2: 21 mmol/L — AB (ref 22–32)
Creatinine, Ser: 0.9 mg/dL (ref 0.61–1.24)
GFR calc Af Amer: >60 mL/min (ref >60)
GFR calc non Af Amer: >60 mL/min (ref >60)
GLUCOSE: 188 mg/dL — AB (ref 65–99)
Potassium: 3.5 mmol/L (ref 3.5–5.1)
SODIUM: 130 mmol/L — AB (ref 135–145)
Total Bilirubin: 0.4 mg/dL (ref 0.3–1.2)
Total Protein: 5.9 g/dL — ABNORMAL LOW (ref 6.5–8.1)

## 2017-01-20 MED ORDER — METFORMIN HCL ER 500 MG PO TB24
500.0000 mg | ORAL_TABLET | Freq: Every day | ORAL | Status: DC
  Administered 2017-01-21: 500 mg via ORAL
  Filled 2017-01-20: qty 1

## 2017-01-20 MED ORDER — GLIPIZIDE ER 2.5 MG PO TB24
2.5000 mg | ORAL_TABLET | Freq: Every day | ORAL | Status: DC
  Administered 2017-01-21: 2.5 mg via ORAL
  Filled 2017-01-20 (×2): qty 1

## 2017-01-20 MED ORDER — ROPINIROLE HCL 0.5 MG PO TABS
0.2500 mg | ORAL_TABLET | Freq: Every day | ORAL | Status: DC
  Administered 2017-01-20: 0.25 mg via ORAL
  Filled 2017-01-20: qty 1

## 2017-01-20 MED ORDER — METFORMIN HCL ER 500 MG PO TB24: 500.0000 mg | ORAL_TABLET | Freq: Every day | ORAL | Status: DC

## 2017-01-20 MED ORDER — GUAIFENESIN-DM 100-10 MG/5ML PO SYRP
10.0000 mL | ORAL_SOLUTION | Freq: Once | ORAL | Status: AC
  Administered 2017-01-20: 10 mL via ORAL
  Filled 2017-01-20: qty 10

## 2017-01-20 NOTE — Progress Notes (Signed)
Pt states uncomfortable taking glipizide. States every time has taken in past, blood sugar has tanked. States spoke w/ DM educator yesterday re: alternative med. Relayed to attending MD

## 2017-01-20 NOTE — Progress Notes (Signed)
PROGRESS NOTE    Carlos Shelton  ZOX:[id number] DOB: 1955/05/27 DOA: 01/18/2017 PCP: Georgann HousekeeperHusain, Karrar, MD   Specialists:     Brief Narrative:  [age] ? dm ty ii, htn, prior lap chole 05/19/10 + hernia repair, R sided injury ~ 2008 after a fall---Seen in ED for pain in side on 12/14/16 and sent home.  subsequently developed cough   [facility] for Sepsis 2/2 PNA-CXR suggest PNA, LActic acid >4--->2, Tachypnea 30-sodium 130. Creat 1.6 on admit up from prior 0.8    Assessment & Plan:   Principal Problem:   Sepsis due to pneumonia [facility](HCC) Active Problems:   COPD (chronic obstructive pulmonary disease) (HCC)   Chronic back pain   Diabetes mellitus type II, non insulin dependent (HCC)   DKA (diabetic ketoacidoses) (HCC)   Hyponatremia   Kidney disease   Sepsis from Bact PNA-probably from poor resp effort from rib injury earlier in month-cont Ceftriaxone/Azithro-narrow in 24 hours to orals.  Lactic acid trended down 2.7-->1.9. for cough delsym 30 bi, tussionex 5 q 12 prn ?Prior COPD-cont Duoneb 3q4 prn--staes was diagnosed 10 yrs ago--never been taking inhalers since Recent fall-cont Norco 5-325 q 6 prn--Use tylenol as 1st choice AKI-Creat on admit 27/1.4-->19/0.9--saline locked-eating drinking ok Hypovol hyponatremia-sod 126 on admit-->130. Saline locked 12/18.  recheck am--no work up needed currently as stabilized DM ty ii-sugars 230 range 12/19-added lantus 10 U, cont SSI--currently 156-188.  Resume Glipizide PO 2.5 12/20 Sinus tach-from sepsis physiology--stable for Med surg--Tachy is controlled without any red alarms on monitors    DVT prophylaxis: lovenox Code Status:  Full  Family Communication: none Disposition Plan: trasnfer SDU   Consultants:   none  Procedures:   none  Antimicrobials:   Ceftriaxone 12.18  Azithromycin 12.18   Subjective: Coughing all night Also restless legs Asking for meds for this nio cp Stool +  no n/v No fever no  chills Overall looks improved  Objective: Vitals:   01/20/17 0400 01/20/17 0500 01/20/17 0600 01/20/17 0755  BP: 117/89 117/75 138/90 106/77  Pulse: 84 82 92 90  Resp: (!) 21 18 (!) 22 18  Temp:    99 F (37.2 C)  TempSrc:    Oral  SpO2: 95% 96% 91% 92%  Weight:      Height:        Intake/Output Summary (Last 24 hours) at 01/20/2017 0849 Last data filed at 01/20/2017 0600 Gross per 24 hour  Intake 762 ml  Output 1750 ml  Net -988 ml   Filed Weights   01/18/17 1422 01/19/17 0001  Weight: 63.5 kg (140 lb) 67.8 kg (149 lb 7.6 oz)    Examination:  Cough++ eomi ncat cta b without rales rhonchi abd soft nt dn no rebound no guard No Le edema Moves 4 limbs equally no gross deficit, no rash No LE edema Tattoo on Upper back noted   Data Reviewed: I have personally reviewed following labs and imaging studies  CBC: Recent Labs  Lab 01/18/17 1428 01/20/17 0422  WBC 12.2* 13.4*  NEUTROABS 9.9* 11.0*  HGB 14.9 11.4*  HCT 42.1 33.1*  MCV 83.7 84.9  PLT 211 192   Basic Metabolic Panel: Recent Labs  Lab 01/18/17 1428 01/19/17 0228 01/20/17 0422  NA 126* 130* 130*  K 3.7 3.4* 3.5  CL 91* 98* 103  CO2 21* 21* 21*  GLUCOSE 507* 324* 188*  BUN 27* 27* 19  CREATININE 1.69* 1.42* 0.90  CALCIUM 8.4* 7.9* 7.9*   GFR:  Estimated Creatinine Clearance: 82.7 mL/min (by C-G formula based on SCr of 0.9 mg/dL). Liver Function Tests: Recent Labs  Lab 01/19/17 0228 01/20/17 0422  AST 20 22  ALT 15* 15*  ALKPHOS 65 72  BILITOT 0.9 0.4  PROT 5.7* 5.9*  ALBUMIN 2.4* 2.3*   No results for input(s): LIPASE, AMYLASE in the last 168 hours. No results for input(s): AMMONIA in the last 168 hours. Coagulation Profile: No results for input(s): INR, PROTIME in the last 168 hours. Cardiac Enzymes: Recent Labs  Lab 01/18/17 1428  TROPONINI <0.03   BNP (last 3 results) No results for input(s): PROBNP in the last 8760 hours. HbA1C: No results for input(s): HGBA1C in the  last 72 hours. CBG: Recent Labs  Lab 01/19/17 1158 01/19/17 1632 01/19/17 2029 01/19/17 2145 01/20/17 0752  GLUCAP 356* 303* 248* 234* 156*   Lipid Profile: No results for input(s): CHOL, HDL, LDLCALC, TRIG, CHOLHDL, LDLDIRECT in the last 72 hours. Thyroid Function Tests: No results for input(s): TSH, T4TOTAL, FREET4, T3FREE, THYROIDAB in the last 72 hours. Anemia Panel: No results for input(s): VITAMINB12, FOLATE, FERRITIN, TIBC, IRON, RETICCTPCT in the last 72 hours. Urine analysis:    Component Value Date/Time   COLORURINE YELLOW 01/18/2017 2000   APPEARANCEUR CLEAR 01/18/2017 2000   LABSPEC 1.025 01/18/2017 2000   PHURINE 6.0 01/18/2017 2000   GLUCOSEU >=500 (A) 01/18/2017 2000   HGBUR NEGATIVE 01/18/2017 2000   BILIRUBINUR NEGATIVE 01/18/2017 2000   KETONESUR 15 (A) 01/18/2017 2000   PROTEINUR NEGATIVE 01/18/2017 2000   UROBILINOGEN 0.2 03/02/2010 1416   NITRITE NEGATIVE 01/18/2017 2000   LEUKOCYTESUR NEGATIVE 01/18/2017 2000     Radiology Studies: Reviewed images personally in health database    Scheduled Meds: . dextromethorphan  30 mg Oral BID  . heparin  5,000 Units Subcutaneous Q8H  . insulin aspart  0-15 Units Subcutaneous TID WC  . insulin aspart  0-5 Units Subcutaneous QHS  . insulin glargine  10 Units Subcutaneous Daily   Continuous Infusions: . azithromycin 500 mg (01/19/17 1755)  . cefTRIAXone (ROCEPHIN)  IV Stopped (01/19/17 1801)     LOS: 2 days    Time spent: 8525    Pleas KochJai Israel Werts, MD [facility](P) [phone]   If 7PM-7AM, please contact night-coverage [email or web address] Password [email or web address] 01/20/2017, 8:49 AM

## 2017-01-20 NOTE — Progress Notes (Signed)
Attempted to call report on pt, receiving RN busy. Provided name and number

## 2017-01-20 NOTE — Progress Notes (Signed)
1430 Received pt from 3N, A&O x4. Productive cough noted. Denies SOB, no chest pain.

## 2017-01-20 NOTE — Plan of Care (Signed)
Patient is cooperative in care. He asked questions regarding his medications and he was educated about them and he demonstrated understanding. Will continue to monitor and educate.

## 2017-01-20 NOTE — Progress Notes (Signed)
Inpatient Diabetes Program Recommendations  AACE/ADA: New Consensus Statement on Inpatient Glycemic Control (2015)  Target Ranges:  Prepandial:   less than 140 mg/dL      Peak postprandial:   less than 180 mg/dL (1-2 hours)      Critically ill patients:  140 - 180 mg/dL   Lab Results  Component Value Date   GLUCAP 119 (H) 01/20/2017    Review of Glycemic Control Results for Carlos Shelton, Tejas L (MRN [id number]) as of 01/20/2017 13:26  Ref. Range 01/19/2017 20:29 01/19/2017 21:45 01/20/2017 04:22 01/20/2017 07:52 01/20/2017 12:12  Glucose-Capillary Latest Ref Range: 65 - 99 mg/dL 829248 (H) 562234 (H)  130156 (H) 119 (H)   Diabetes history: Type 2 DM Outpatient Diabetes medications: Glipizide PO (not specified dosage) Current orders for Inpatient glycemic control: Novolog 0-5 HS, Novolog 0-15 Units TID  Inpatient Diabetes Program Recommendations:    Noted the Lantus order and blood sugars have improved. No recs for today.   Also of note, based on the conversation I had with the patient yesterday, will continue to refuse Glipizide because he fears lows. Given his distrust in this medication regimen would recommend Tradjenta 5mg  PO QD for discharge planning to combat noncompliance.   Thanks, Lujean RaveLauren Jahnai [place], MSN, RNC-OB Diabetes Coordinator [phone] (8a-5p)

## 2017-01-20 NOTE — Progress Notes (Signed)
[facility] receiving RN 6N. Pt to be transported by wheelchair

## 2017-01-21 ENCOUNTER — Other Ambulatory Visit: Payer: Self-pay

## 2017-01-21 LAB — GLUCOSE, CAPILLARY
GLUCOSE-CAPILLARY: 172 mg/dL — AB (ref 65–99)
Glucose-Capillary: 185 mg/dL — ABNORMAL HIGH (ref 65–99)

## 2017-01-21 MED ORDER — HYDROCOD POLST-CPM POLST ER 10-8 MG/5ML PO SUER: 5.0000 mL | Freq: Two times a day (BID) | ORAL | 0 refills | Status: AC | PRN

## 2017-01-21 MED ORDER — METFORMIN HCL ER 500 MG PO TB24: 500.0000 mg | ORAL_TABLET | Freq: Every day | ORAL | 0 refills | Status: AC

## 2017-01-21 MED ORDER — ROPINIROLE HCL 0.25 MG PO TABS: 0.2500 mg | ORAL_TABLET | Freq: Every day | ORAL | 2 refills | Status: DC

## 2017-01-21 MED ORDER — METFORMIN HCL ER 500 MG PO TB24: 500.0000 mg | ORAL_TABLET | Freq: Every day | ORAL | 0 refills | Status: DC

## 2017-01-21 MED ORDER — HYDROCOD POLST-CPM POLST ER 10-8 MG/5ML PO SUER: 5.0000 mL | Freq: Two times a day (BID) | ORAL | 0 refills | Status: DC | PRN

## 2017-01-21 MED ORDER — LEVOFLOXACIN 500 MG PO TABS: 500.0000 mg | ORAL_TABLET | Freq: Every day | ORAL | 0 refills | Status: DC

## 2017-01-21 MED ORDER — LEVOFLOXACIN 500 MG PO TABS: 500.0000 mg | ORAL_TABLET | Freq: Every day | ORAL | 0 refills | Status: AC

## 2017-01-21 NOTE — Consult Note (Signed)
   [facility] CM Inpatient Consult   01/21/2017  Carlos Shelton 1955-10-17 [id number]  Patient screened for potential [facility] services as a high risk for re-admission in the [facility] Medicare/ACO Met with the patient at the bedside admitted with CAP and Hx of COPD, Diabetes.  Patient states he has a new primary care provider Sanjuana Mae, MD.  He states he will need an appointment with him.  He states he will make his own appointment.  He states, "I am pretty sure I will have new medicines and I uses.  Patient verbally agreeable for EMMI calls for now.  He states, "I will keep your information in case I need the other options."  Patient accepted the brochure, 24 hour nurse advise line with contact information.  He confirms his Lucretia Kern number is [phone].  Patient verbalized no home visit needs at this time.    For questions, please contact:  Natividad Brood, RN BSN [facility] Liaison  [phone] business mobile phone Toll free office [phone]

## 2017-01-21 NOTE — Care Management Important Message (Signed)
Important Message  Patient Details  Name: Morrison OldClayman L Fuquay MRN: [id number] Date of Birth: 07-01-1955   Medicare Important Message Given:  Yes    Jermine Bibbee Stefan ChurchBratton 01/21/2017, 1:58 PM

## 2017-01-21 NOTE — Social Work (Signed)
CSW consulted to provide pt with bus pass, pt is able to navigate the bus to the hotel he is staying.  CSW provided pt with [employer] bus pass.   CSW signing off. Please consult if any additional needs arise.  Doy HutchingIsabel H Nicolus Ose, LCSWA [facility] Clinical Social Work [phone]

## 2017-01-21 NOTE — Discharge Summary (Signed)
Physician Discharge Summary  Morrison OldClayman L Catapano ZOX:[id number] DOB: 03-Nov-1955 DOA: 01/18/2017  PCP: Georgann HousekeeperHusain, Karrar, MD  Admit date: 01/18/2017 Discharge date: 01/21/2017  Time spent: 25 minutes  Recommendations for Outpatient Follow-up:  1. Complete evaquin 12/26 2. Started metfromin XL 500 this admit-needs titration as OP-labs 1-2 weeks-given Requip qwith refills as well and limited amount tussionex on d/c home for cough 3. Needs CXR 1 mo ensuring clearing PNA   Discharge Diagnoses:  Principal Problem:   Sepsis due to pneumonia [facility](HCC) Active Problems:   COPD (chronic obstructive pulmonary disease) (HCC)   Chronic back pain   Diabetes mellitus type II, non insulin dependent (HCC)   DKA (diabetic ketoacidoses) (HCC)   Hyponatremia   Kidney disease   Discharge Condition: improved  Diet recommendation: diabetic hh  Filed Weights   01/18/17 1422 01/19/17 0001  Weight: 63.5 kg (140 lb) 67.8 kg (149 lb 7.6 oz)   61 ? dm ty ii, htn, prior lap chole 05/19/10 + hernia repair, R sided injury ~ 2008 after a fall---Seen in ED for pain in side on 12/14/16 and sent home.  subsequently developed cough   [facility] concerns for Sepsis 2/2 PNA-CXR suggest PNA, LActic acid >4--->2, Tachypnea 30-sodium 130. Creat 1.6 on admit up from prior 0.8  History of present illness:  Sepsis from Bact PNA-probably from poor resp effort from rib injury earlier in month- Ceftriaxone/Azithro-->levaquin till 12/26 on d/c.  Lactic acid trended down 2.7-->1.9. On d.c given tussionex 5 q 12 prn ?Prior COPD-cont Duoneb 3q4 prn--staes was diagnosed 10 yrs ago--never been taking inhalers since--no wheeze on d/c and d/cthe same Recent fall-cont Norco 5-325 q 6 prn--Use tylenol as 1st choice-Naproxen as 2nd AKI-Creat on admit 27/1.4-->19/0.9--saline locked-eating drinking ok-labs as an OP Hypovol hyponatremia-sod 126 on admit-->130. Needs labs as OP DM ty ii-sugars 230 range 12/19-added lantus 10 U, cont  SSI--currently 156-188.  Replace Glipizide PO 2.5 w Metformin XL 500-OP titration as per PCP Sinus tach-from sepsis physiology--stable for Med surg--Tachy is controlled without any red alarms on monitors    Discharge Exam: Vitals:   01/20/17 2123 01/21/17 0527  BP: 129/72 117/70  Pulse: 96 93  Resp: 18 16  Temp: 98.7 F (37.1 C) 98.4 F (36.9 C)  SpO2: 95% 93%    General: eomi ncat throat supple soft and non tender  Cardiovascular:  s1 s2 no m/r/g Respiratory: chest clear without added sound no rales no rhonchi abd soft LE non swollen smile symm and gorssly neuro intact  Discharge Instructions   Discharge Instructions    Diet - low sodium heart healthy   Complete by:  As directed    Discharge instructions   Complete by:  As directed    Please complete the course of levaquin Please take metformin for the sugar-only giving a limited course as you might need a higher dose on follow up at pcp-this medication also necessitates some lab work so be sure to follow up as an OP We will Rx the Requip for you and give refills   Increase activity slowly   Complete by:  As directed      Allergies as of 01/21/2017   No Known Allergies     Medication List    STOP taking these medications   GLIPIZIDE PO   HYDROcodone-acetaminophen 5-325 MG tablet Commonly known as:  NORCO/VICODIN     TAKE these medications   chlorpheniramine-HYDROcodone 10-8 MG/5ML Suer Commonly known as:  TUSSIONEX Take 5 mLs by mouth every  12 (twelve) hours as needed for cough.   levofloxacin 500 MG tablet Commonly known as:  LEVAQUIN Take 1 tablet (500 mg total) by mouth daily for 7 days.   metFORMIN 500 MG 24 hr tablet Commonly known as:  GLUCOPHAGE-XR Take 1 tablet (500 mg total) by mouth daily with breakfast. Start taking on:  01/22/2017   naproxen 375 MG tablet Commonly known as:  NAPROSYN Take 1 tablet (375 mg total) 2 (two) times daily by mouth.   rOPINIRole 0.25 MG tablet Commonly known  as:  REQUIP Take 1 tablet (0.25 mg total) by mouth at bedtime.      No Known Allergies    The results of significant diagnostics from this hospitalization (including imaging, microbiology, ancillary and laboratory) are listed below for reference.    Significant Diagnostic Studies: Dg Chest 2 View  Result Date: 01/18/2017 CLINICAL DATA:  Cough and congestion. EXAM: CHEST  2 VIEW COMPARISON:  12/14/2016.  11/08/2014. FINDINGS: Mediastinum hilar structures normal. Is stable elevation left hemidiaphragm. Mild bibasilar atelectasis/ infiltrates. No prominent pleural effusion or pneumothorax. Heart size stable. No acute bony abnormality. Surgical clips right upper quadrant. IMPRESSION: Stable elevation left hemidiaphragm. Mild bibasilar atelectasis/infiltrates. Infiltrates best identified on lateral view. Follow-up chest x-rays demonstrate clearing suggested. Electronically Signed   By: Maisie Fus  Register   On: 01/18/2017 15:06    Microbiology: Recent Results (from the past 240 hour(s))  Blood Culture (routine x 2)     Status: None (Preliminary result)   Collection Time: 01/18/17  3:39 PM  Result Value Ref Range Status   Specimen Description BLOOD LEFT FOREARM  Final   Special Requests   Final    BOTTLES DRAWN AEROBIC AND ANAEROBIC Blood Culture adequate volume   Culture   Final    NO GROWTH 2 DAYS Performed at [facility], [address] [place], [place] [address]    Report Status PENDING  Incomplete  MRSA PCR Screening     Status: None   Collection Time: 01/18/17 11:56 PM  Result Value Ref Range Status   MRSA by PCR NEGATIVE NEGATIVE Final    Comment:        The GeneXpert MRSA Assay (FDA approved for NASAL specimens only), is one component of a comprehensive MRSA colonization surveillance program. It is not intended to diagnose MRSA infection nor to guide or monitor treatment for MRSA infections.   Culture, sputum-assessment     Status: None   Collection Time:  01/19/17  1:00 PM  Result Value Ref Range Status   Specimen Description SPUTUM  Final   Special Requests NONE  Final   Sputum evaluation THIS SPECIMEN IS ACCEPTABLE FOR SPUTUM CULTURE  Final   Report Status 01/19/2017 FINAL  Final  Culture, respiratory (NON-Expectorated)     Status: None (Preliminary result)   Collection Time: 01/19/17  1:00 PM  Result Value Ref Range Status   Specimen Description SPUTUM  Final   Special Requests NONE Reflexed from [id number]  Final   Gram Stain   Final    MODERATE WBC PRESENT, PREDOMINANTLY PMN RARE GRAM NEGATIVE RODS RARE GRAM POSITIVE COCCI IN CLUSTERS    Culture TOO YOUNG TO READ  Final   Report Status PENDING  Incomplete     Labs: Basic Metabolic Panel: Recent Labs  Lab 01/18/17 1428 01/19/17 0228 01/20/17 0422  NA 126* 130* 130*  K 3.7 3.4* 3.5  CL 91* 98* 103  CO2 21* 21* 21*  GLUCOSE 507* 324* 188*  BUN 27*  27* 19  CREATININE 1.69* 1.42* 0.90  CALCIUM 8.4* 7.9* 7.9*   Liver Function Tests: Recent Labs  Lab 01/19/17 0228 01/20/17 0422  AST 20 22  ALT 15* 15*  ALKPHOS 65 72  BILITOT 0.9 0.4  PROT 5.7* 5.9*  ALBUMIN 2.4* 2.3*   No results for input(s): LIPASE, AMYLASE in the last 168 hours. No results for input(s): AMMONIA in the last 168 hours. CBC: Recent Labs  Lab 01/18/17 1428 01/20/17 0422  WBC 12.2* 13.4*  NEUTROABS 9.9* 11.0*  HGB 14.9 11.4*  HCT 42.1 33.1*  MCV 83.7 84.9  PLT 211 192   Cardiac Enzymes: Recent Labs  Lab 01/18/17 1428  TROPONINI <0.03   BNP: BNP (last 3 results) No results for input(s): BNP in the last 8760 hours.  ProBNP (last 3 results) No results for input(s): PROBNP in the last 8760 hours.  CBG: Recent Labs  Lab 01/20/17 0752 01/20/17 1212 01/20/17 1657 01/20/17 2121 01/21/17 0747  GLUCAP 156* 119* 239* 150* 185*       Signed:  Rhetta MuraJai-Gurmukh Bascom Biel MD   [facility] 01/21/2017, 8:33 AM

## 2017-01-22 LAB — CULTURE, RESPIRATORY

## 2017-01-22 LAB — CULTURE, RESPIRATORY W GRAM STAIN: Culture: NORMAL

## 2017-01-23 LAB — CULTURE, BLOOD (ROUTINE X 2)
Culture: NO GROWTH
Special Requests: ADEQUATE

## 2017-02-04 ENCOUNTER — Other Ambulatory Visit: Payer: Self-pay

## 2017-02-04 NOTE — Patient Outreach (Signed)
[facility]) Care Management  02/04/2017  Morrison OldClayman L Stutz 1955-07-19 [id number]     EMMI-COPD RED ON EMMI ALERT Day # 8 Date: 02/03/17 Red Alert Reason: "Feeling worse overall? Yes  Mucus change color? Yes"   Outreach attempt # 1 to patient. No answer at present. RN CM left HIPAA compliant voicemail message along with contact info.       Plan: RN CM will make outreach attempt to patient within one business day if no return call from patient.   Antionette Fairyoshanda Prinston Kynard, RN,BSN,CCM [facility] Care Management Telephonic Care Management Coordinator Direct Phone: [phone] Toll Free: [phone] Fax: [phone]

## 2017-02-07 ENCOUNTER — Other Ambulatory Visit: Payer: Self-pay

## 2017-02-07 NOTE — Patient Outreach (Signed)
[facility]) Care Management  02/07/2017  Carlos OldClayman L Shelton Nov 29, 1955 [id number]     EMMI-COPD RED ON EMMI ALERT Day # 8 Date: 02/03/17 Red Alert Reason: "Feeling worse overall? Yes  Mucus change color? Yes"   Outreach attempt #2 to patient. No answer at present.      Plan: RN CM will send unsuccessful outreach letter to patient and close case if no response within 10 business days.   Carlos Fairyoshanda Theodore Rahrig, RN,BSN,CCM [facility] Care Management Telephonic Care Management Coordinator Direct Phone: [phone] Toll Free: [phone] Fax: [phone]

## 2017-02-21 ENCOUNTER — Other Ambulatory Visit: Payer: Self-pay

## 2017-02-21 NOTE — Patient Outreach (Signed)
[facility] Care Management  02/21/2017  [name] 1955-05-06 [id number]   EMMI-COPD RED ON EMMI ALERT Day #8 Date:02/03/17 Red Alert Reason:"Feeling worse overall? Yes Mucus change color? Yes"     Multiple attempts to establish contact with patient without success. No response from letter mailed to patient. Case is being closed at this time.      Plan: RN CM will notify [facility] administrative assistant of case status. RN CM unable to send MD letter as no PCP listed on file.   Antionette Fairyoshanda Zaylynn Rickett, RN,BSN,CCM [facility] Care Management Telephonic Care Management Coordinator Direct Phone: [phone] Toll Free: [phone] Fax: [phone]

## 2017-03-01 DIAGNOSIS — G40909 Epilepsy, unspecified, not intractable, without status epilepticus: Secondary | ICD-10-CM | POA: Diagnosis not present

## 2017-03-01 DIAGNOSIS — Z1389 Encounter for screening for other disorder: Secondary | ICD-10-CM | POA: Diagnosis not present

## 2017-03-01 DIAGNOSIS — J449 Chronic obstructive pulmonary disease, unspecified: Secondary | ICD-10-CM | POA: Diagnosis not present

## 2017-03-01 DIAGNOSIS — K219 Gastro-esophageal reflux disease without esophagitis: Secondary | ICD-10-CM | POA: Diagnosis not present

## 2017-03-01 DIAGNOSIS — R21 Rash and other nonspecific skin eruption: Secondary | ICD-10-CM | POA: Diagnosis not present

## 2017-03-01 DIAGNOSIS — G2581 Restless legs syndrome: Secondary | ICD-10-CM | POA: Diagnosis not present

## 2017-03-01 DIAGNOSIS — E7800 Pure hypercholesterolemia, unspecified: Secondary | ICD-10-CM | POA: Diagnosis not present

## 2017-03-01 DIAGNOSIS — M549 Dorsalgia, unspecified: Secondary | ICD-10-CM | POA: Diagnosis not present

## 2017-03-01 DIAGNOSIS — E1165 Type 2 diabetes mellitus with hyperglycemia: Secondary | ICD-10-CM | POA: Diagnosis not present

## 2017-03-25 ENCOUNTER — Encounter: Payer: Self-pay | Admitting: Podiatry

## 2017-03-25 ENCOUNTER — Ambulatory Visit: Payer: Medicare HMO | Admitting: Podiatry

## 2017-04-04 DIAGNOSIS — H2513 Age-related nuclear cataract, bilateral: Secondary | ICD-10-CM | POA: Diagnosis not present

## 2017-04-04 DIAGNOSIS — E113413 Type 2 diabetes mellitus with severe nonproliferative diabetic retinopathy with macular edema, bilateral: Secondary | ICD-10-CM | POA: Diagnosis not present

## 2017-04-19 ENCOUNTER — Ambulatory Visit
Admission: RE | Admit: 2017-04-19 | Discharge: 2017-04-19 | Disposition: A | Discharge status: Home / Self Care | Payer: Medicare HMO | Source: Ambulatory Visit | Attending: Internal Medicine | Admitting: Internal Medicine

## 2017-04-19 ENCOUNTER — Other Ambulatory Visit: Payer: Self-pay | Admitting: Internal Medicine

## 2017-04-19 DIAGNOSIS — E1165 Type 2 diabetes mellitus with hyperglycemia: Secondary | ICD-10-CM | POA: Diagnosis not present

## 2017-04-19 DIAGNOSIS — J449 Chronic obstructive pulmonary disease, unspecified: Secondary | ICD-10-CM | POA: Diagnosis not present

## 2017-04-19 DIAGNOSIS — Z125 Encounter for screening for malignant neoplasm of prostate: Secondary | ICD-10-CM | POA: Diagnosis not present

## 2017-04-19 DIAGNOSIS — K219 Gastro-esophageal reflux disease without esophagitis: Secondary | ICD-10-CM | POA: Diagnosis not present

## 2017-04-19 DIAGNOSIS — Z7984 Long term (current) use of oral hypoglycemic drugs: Secondary | ICD-10-CM | POA: Diagnosis not present

## 2017-04-19 DIAGNOSIS — Z1211 Encounter for screening for malignant neoplasm of colon: Secondary | ICD-10-CM | POA: Diagnosis not present

## 2017-04-19 DIAGNOSIS — M549 Dorsalgia, unspecified: Secondary | ICD-10-CM | POA: Diagnosis not present

## 2017-04-19 DIAGNOSIS — E113213 Type 2 diabetes mellitus with mild nonproliferative diabetic retinopathy with macular edema, bilateral: Secondary | ICD-10-CM | POA: Diagnosis not present

## 2017-04-19 DIAGNOSIS — G2581 Restless legs syndrome: Secondary | ICD-10-CM | POA: Diagnosis not present

## 2017-04-19 DIAGNOSIS — Z0000 Encounter for general adult medical examination without abnormal findings: Secondary | ICD-10-CM | POA: Diagnosis not present

## 2017-04-19 DIAGNOSIS — R05 Cough: Secondary | ICD-10-CM | POA: Diagnosis not present

## 2017-04-19 DIAGNOSIS — E7800 Pure hypercholesterolemia, unspecified: Secondary | ICD-10-CM | POA: Diagnosis not present

## 2017-04-19 DIAGNOSIS — Z1389 Encounter for screening for other disorder: Secondary | ICD-10-CM | POA: Diagnosis not present

## 2017-08-08 DIAGNOSIS — K219 Gastro-esophageal reflux disease without esophagitis: Secondary | ICD-10-CM | POA: Diagnosis not present

## 2017-08-08 DIAGNOSIS — E113213 Type 2 diabetes mellitus with mild nonproliferative diabetic retinopathy with macular edema, bilateral: Secondary | ICD-10-CM | POA: Diagnosis not present

## 2017-08-08 DIAGNOSIS — E7800 Pure hypercholesterolemia, unspecified: Secondary | ICD-10-CM | POA: Diagnosis not present

## 2017-08-08 DIAGNOSIS — E1165 Type 2 diabetes mellitus with hyperglycemia: Secondary | ICD-10-CM | POA: Diagnosis not present

## 2017-08-08 DIAGNOSIS — J449 Chronic obstructive pulmonary disease, unspecified: Secondary | ICD-10-CM | POA: Diagnosis not present

## 2017-08-08 DIAGNOSIS — Z7984 Long term (current) use of oral hypoglycemic drugs: Secondary | ICD-10-CM | POA: Diagnosis not present

## 2017-08-08 DIAGNOSIS — G2581 Restless legs syndrome: Secondary | ICD-10-CM | POA: Diagnosis not present

## 2017-11-04 DIAGNOSIS — Z23 Encounter for immunization: Secondary | ICD-10-CM | POA: Diagnosis not present

## 2017-11-04 DIAGNOSIS — J449 Chronic obstructive pulmonary disease, unspecified: Secondary | ICD-10-CM | POA: Diagnosis not present

## 2017-11-04 DIAGNOSIS — G2581 Restless legs syndrome: Secondary | ICD-10-CM | POA: Diagnosis not present

## 2017-11-04 DIAGNOSIS — H269 Unspecified cataract: Secondary | ICD-10-CM | POA: Diagnosis not present

## 2017-11-04 DIAGNOSIS — E113213 Type 2 diabetes mellitus with mild nonproliferative diabetic retinopathy with macular edema, bilateral: Secondary | ICD-10-CM | POA: Diagnosis not present

## 2017-11-04 DIAGNOSIS — R109 Unspecified abdominal pain: Secondary | ICD-10-CM | POA: Diagnosis not present

## 2017-11-04 DIAGNOSIS — E7800 Pure hypercholesterolemia, unspecified: Secondary | ICD-10-CM | POA: Diagnosis not present

## 2018-04-01 IMAGING — CR DG CHEST 2V
2 series · 2 of 2 positions shown · non-contrast
Comparison: 12/14/2016.  11/08/2014.

CLINICAL DATA: Cough and congestion.

EXAM:
CHEST  2 VIEW

[w chest pa]
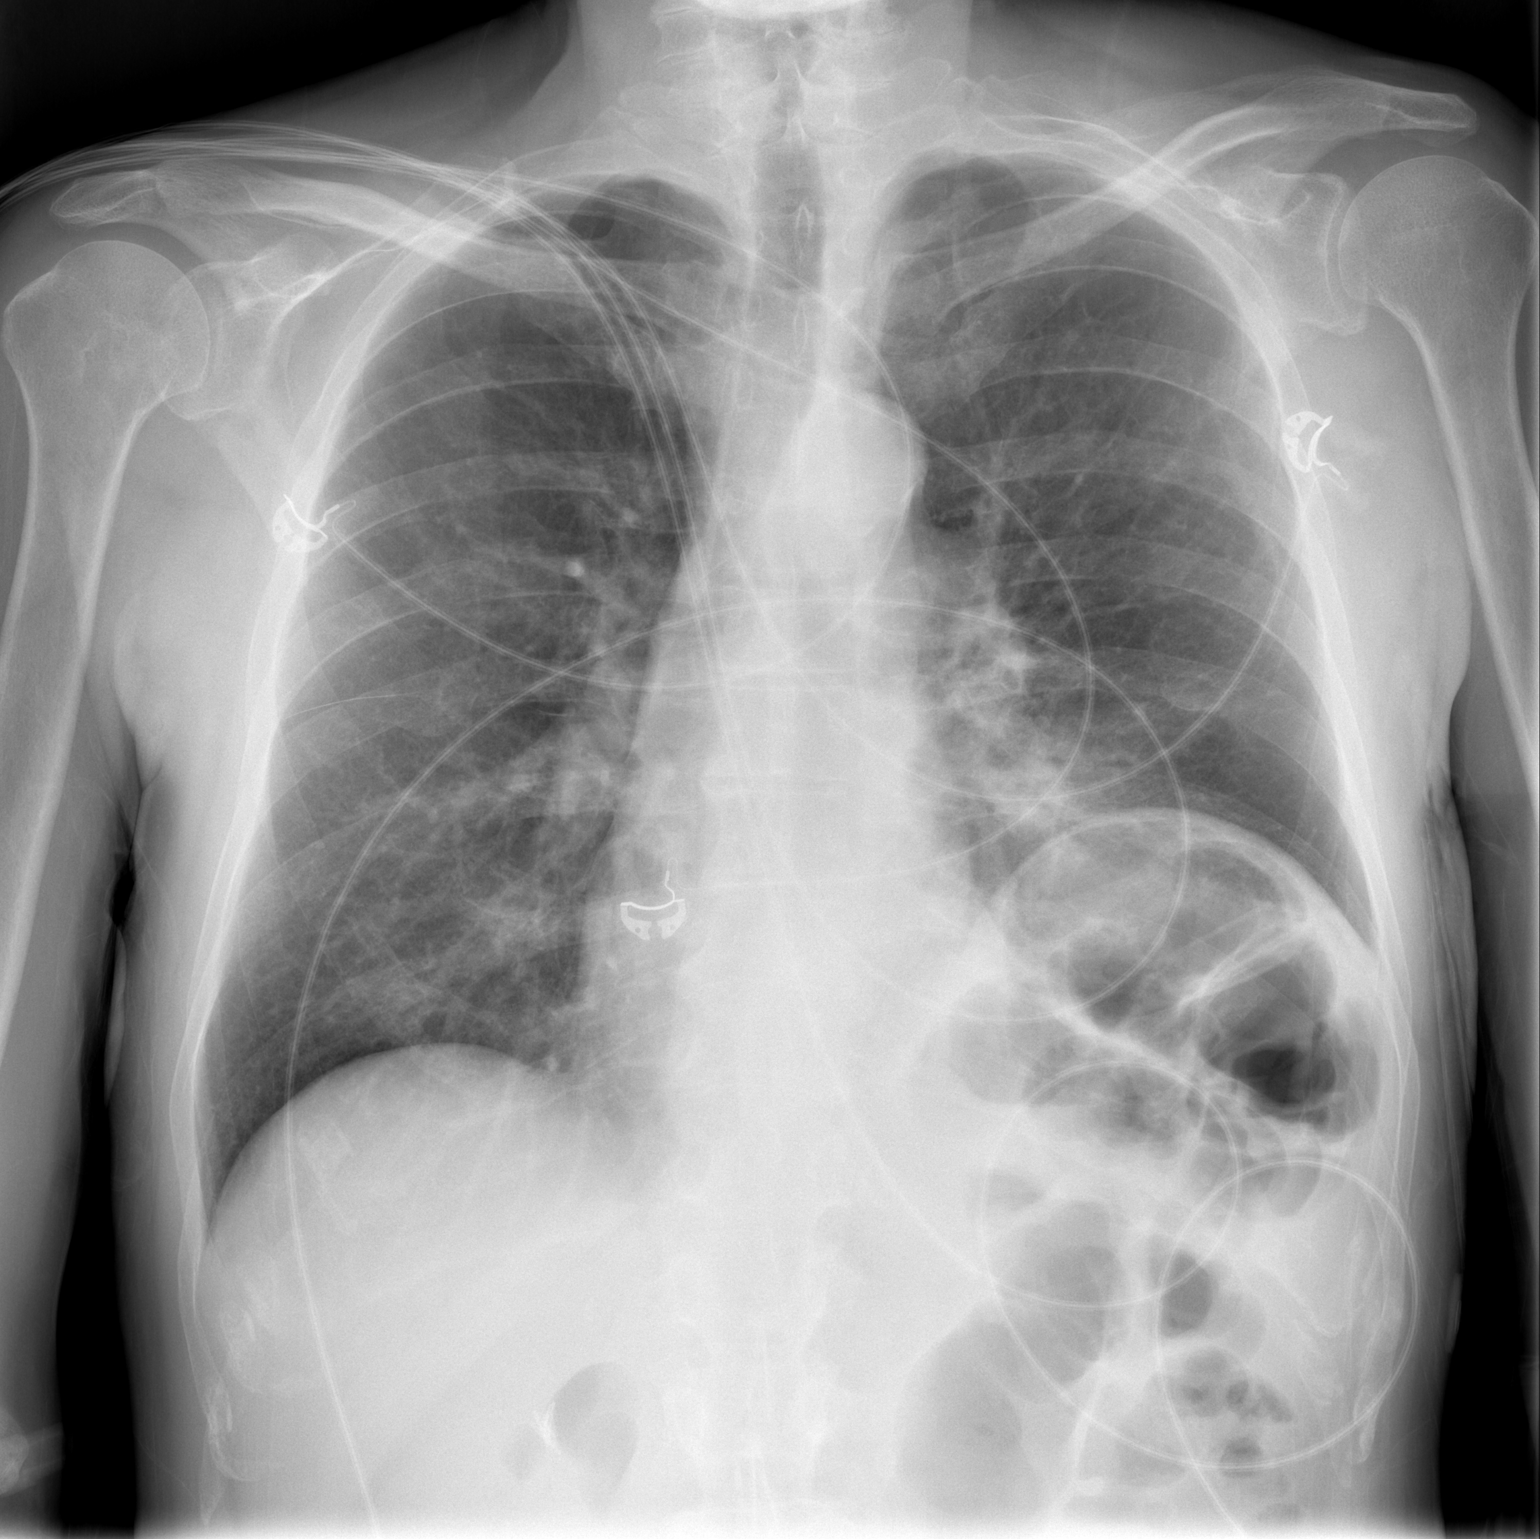

[w chest lat]
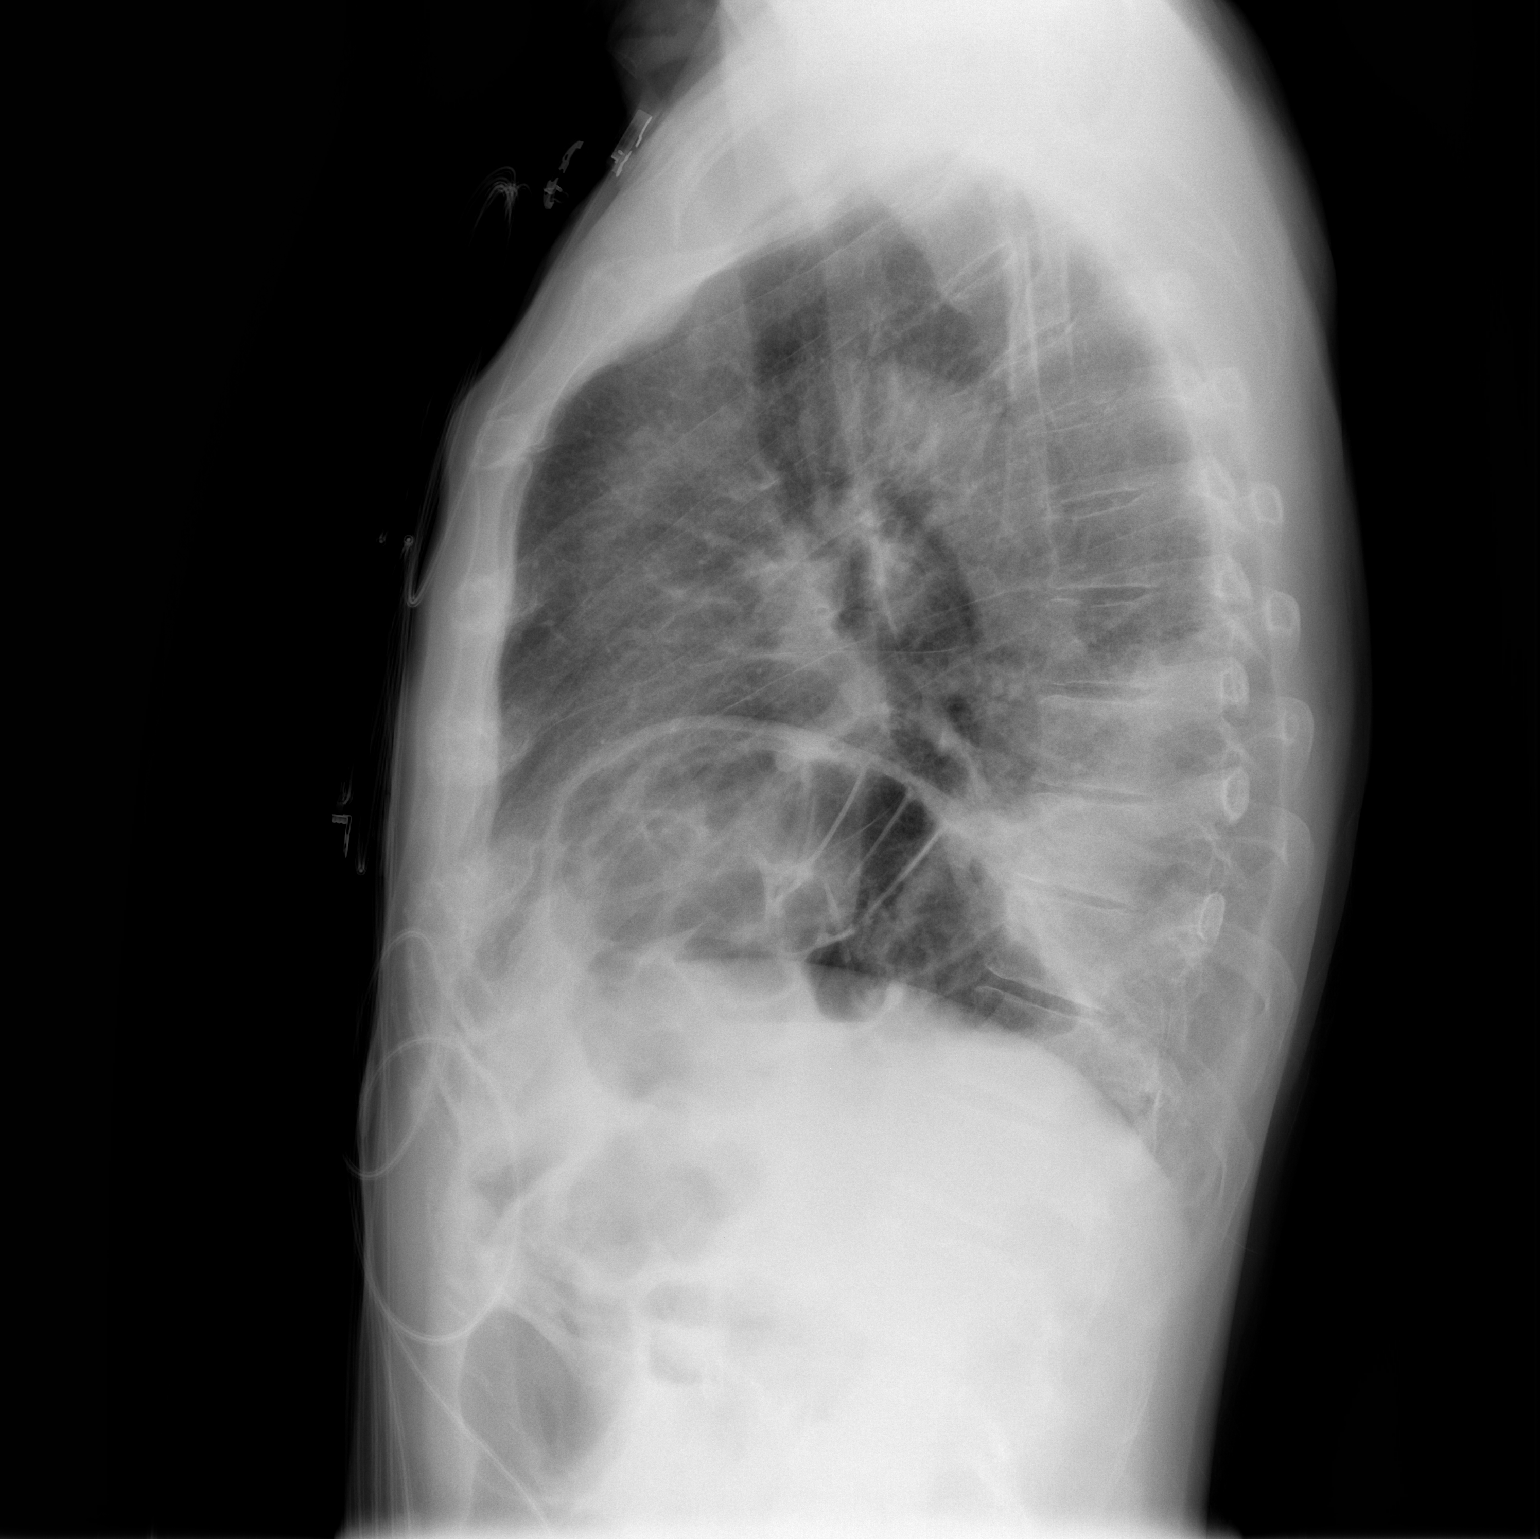

[2 of 2 positions shown; findings below may reference images not displayed]

FINDINGS: Mediastinum hilar structures normal. Is stable elevation left
hemidiaphragm. Mild bibasilar atelectasis/ infiltrates. No prominent
pleural effusion or pneumothorax. Heart size stable. No acute bony
abnormality. Surgical clips right upper quadrant.
IMPRESSION: Stable elevation left hemidiaphragm. Mild bibasilar
atelectasis/infiltrates. Infiltrates best identified on lateral
view. Follow-up chest x-rays demonstrate clearing suggested.

## 2018-04-25 ENCOUNTER — Telehealth: Payer: Self-pay

## 2018-04-25 NOTE — Telephone Encounter (Signed)
Scheduled appt with pt and brother for monday

## 2018-04-25 NOTE — Telephone Encounter (Signed)
Return call attempted as requested. No answer. Plan discussion with patient should they return call to Population Health general line of [phone], option 1.

## 2018-04-27 NOTE — Congregational Nurse Program (Unsigned)
Pt is a 87 year white male, alert and oriented x#3, no complaints of pain. History of COPD using inhaler, gout over 4 years , hypertension-treated, and epilepsy-treated. Using medication as prescribed, no signs of distress observed. Pt concerned with blocked housing request assistance with obtaining birth certificate. Pt agrees to calls from [facility] [place] and Child psychotherapist for well checks and to assist with housing.

## 2018-05-23 ENCOUNTER — Encounter: Payer: Self-pay | Admitting: *Deleted

## 2018-05-23 NOTE — Congregational Nurse Program (Signed)
COVID 19 HOTEL SCREEN Patient did not need any further assistance. 

## 2018-05-30 NOTE — Progress Notes (Signed)
COVID Hotel Screening performed. Temperature, PHQ-9, and need for medical care and medications assessed. No additional needs assessed.  Vonnie Spagnolo RN MSN 

## 2018-06-05 NOTE — Progress Notes (Signed)
Closing encounter per request. 

## 2018-06-06 NOTE — Progress Notes (Signed)
COVID Hotel Screening performed. Temperature, PHQ-9, and need for medical care and medications assessed. No additional needs assessed at this time.  Teralyn Mullins RN MSN 

## 2018-12-04 DIAGNOSIS — Z1389 Encounter for screening for other disorder: Secondary | ICD-10-CM | POA: Diagnosis not present

## 2018-12-04 DIAGNOSIS — E7800 Pure hypercholesterolemia, unspecified: Secondary | ICD-10-CM | POA: Diagnosis not present

## 2018-12-04 DIAGNOSIS — E113213 Type 2 diabetes mellitus with mild nonproliferative diabetic retinopathy with macular edema, bilateral: Secondary | ICD-10-CM | POA: Diagnosis not present

## 2018-12-04 DIAGNOSIS — Z23 Encounter for immunization: Secondary | ICD-10-CM | POA: Diagnosis not present

## 2018-12-04 DIAGNOSIS — J449 Chronic obstructive pulmonary disease, unspecified: Secondary | ICD-10-CM | POA: Diagnosis not present

## 2018-12-04 DIAGNOSIS — Z125 Encounter for screening for malignant neoplasm of prostate: Secondary | ICD-10-CM | POA: Diagnosis not present

## 2018-12-04 DIAGNOSIS — G40909 Epilepsy, unspecified, not intractable, without status epilepticus: Secondary | ICD-10-CM | POA: Diagnosis not present

## 2018-12-04 DIAGNOSIS — E1165 Type 2 diabetes mellitus with hyperglycemia: Secondary | ICD-10-CM | POA: Diagnosis not present

## 2018-12-04 DIAGNOSIS — G2581 Restless legs syndrome: Secondary | ICD-10-CM | POA: Diagnosis not present

## 2019-05-16 DIAGNOSIS — G2581 Restless legs syndrome: Secondary | ICD-10-CM | POA: Diagnosis not present

## 2019-05-16 DIAGNOSIS — G40909 Epilepsy, unspecified, not intractable, without status epilepticus: Secondary | ICD-10-CM | POA: Diagnosis not present

## 2019-05-16 DIAGNOSIS — E1165 Type 2 diabetes mellitus with hyperglycemia: Secondary | ICD-10-CM | POA: Diagnosis not present

## 2019-05-16 DIAGNOSIS — J449 Chronic obstructive pulmonary disease, unspecified: Secondary | ICD-10-CM | POA: Diagnosis not present

## 2019-05-16 DIAGNOSIS — E7800 Pure hypercholesterolemia, unspecified: Secondary | ICD-10-CM | POA: Diagnosis not present

## 2019-06-13 DIAGNOSIS — Z20828 Contact with and (suspected) exposure to other viral communicable diseases: Secondary | ICD-10-CM | POA: Diagnosis not present

## 2019-06-21 DIAGNOSIS — E1165 Type 2 diabetes mellitus with hyperglycemia: Secondary | ICD-10-CM | POA: Diagnosis not present

## 2019-06-21 DIAGNOSIS — I700 Atherosclerosis of aorta: Secondary | ICD-10-CM | POA: Diagnosis not present

## 2019-06-21 DIAGNOSIS — R0602 Shortness of breath: Secondary | ICD-10-CM | POA: Diagnosis not present

## 2019-06-21 DIAGNOSIS — Z8639 Personal history of other endocrine, nutritional and metabolic disease: Secondary | ICD-10-CM | POA: Diagnosis not present

## 2019-06-21 DIAGNOSIS — Z8709 Personal history of other diseases of the respiratory system: Secondary | ICD-10-CM | POA: Diagnosis not present

## 2019-06-21 DIAGNOSIS — Z87891 Personal history of nicotine dependence: Secondary | ICD-10-CM | POA: Diagnosis not present

## 2019-06-21 DIAGNOSIS — Z20822 Contact with and (suspected) exposure to covid-19: Secondary | ICD-10-CM | POA: Diagnosis not present

## 2019-06-21 DIAGNOSIS — J441 Chronic obstructive pulmonary disease with (acute) exacerbation: Secondary | ICD-10-CM | POA: Diagnosis not present

## 2019-06-21 DIAGNOSIS — R9431 Abnormal electrocardiogram [ECG] [EKG]: Secondary | ICD-10-CM | POA: Diagnosis not present

## 2019-06-21 DIAGNOSIS — R05 Cough: Secondary | ICD-10-CM | POA: Diagnosis not present

## 2019-07-20 DIAGNOSIS — E1165 Type 2 diabetes mellitus with hyperglycemia: Secondary | ICD-10-CM | POA: Diagnosis not present

## 2019-07-20 DIAGNOSIS — M549 Dorsalgia, unspecified: Secondary | ICD-10-CM | POA: Diagnosis not present

## 2019-07-20 DIAGNOSIS — J449 Chronic obstructive pulmonary disease, unspecified: Secondary | ICD-10-CM | POA: Diagnosis not present

## 2019-07-23 ENCOUNTER — Encounter (HOSPITAL_COMMUNITY): Payer: Self-pay

## 2019-07-23 ENCOUNTER — Emergency Department (HOSPITAL_COMMUNITY): Payer: Medicare HMO

## 2019-07-23 ENCOUNTER — Emergency Department (HOSPITAL_COMMUNITY)
Admission: EM | Admit: 2019-07-23 | Discharge: 2019-07-24 | Disposition: A | Discharge status: Home / Self Care | Payer: Medicare HMO | Attending: Emergency Medicine | Admitting: Emergency Medicine

## 2019-07-23 DIAGNOSIS — Z7984 Long term (current) use of oral hypoglycemic drugs: Secondary | ICD-10-CM | POA: Insufficient documentation

## 2019-07-23 DIAGNOSIS — Z79899 Other long term (current) drug therapy: Secondary | ICD-10-CM | POA: Diagnosis not present

## 2019-07-23 DIAGNOSIS — Z87891 Personal history of nicotine dependence: Secondary | ICD-10-CM | POA: Insufficient documentation

## 2019-07-23 DIAGNOSIS — J449 Chronic obstructive pulmonary disease, unspecified: Secondary | ICD-10-CM | POA: Insufficient documentation

## 2019-07-23 DIAGNOSIS — G8929 Other chronic pain: Secondary | ICD-10-CM | POA: Diagnosis not present

## 2019-07-23 DIAGNOSIS — M79672 Pain in left foot: Secondary | ICD-10-CM | POA: Diagnosis not present

## 2019-07-23 DIAGNOSIS — R079 Chest pain, unspecified: Secondary | ICD-10-CM

## 2019-07-23 DIAGNOSIS — M25561 Pain in right knee: Secondary | ICD-10-CM | POA: Diagnosis not present

## 2019-07-23 DIAGNOSIS — E119 Type 2 diabetes mellitus without complications: Secondary | ICD-10-CM | POA: Insufficient documentation

## 2019-07-23 DIAGNOSIS — R0789 Other chest pain: Secondary | ICD-10-CM | POA: Diagnosis not present

## 2019-07-23 DIAGNOSIS — M1711 Unilateral primary osteoarthritis, right knee: Secondary | ICD-10-CM | POA: Diagnosis not present

## 2019-07-23 LAB — BASIC METABOLIC PANEL
Anion gap: 9 (ref 5–15)
BUN: 18 mg/dL (ref 8–23)
CO2: 22 mmol/L (ref 22–32)
Calcium: 8.5 mg/dL — ABNORMAL LOW (ref 8.9–10.3)
Chloride: 100 mmol/L (ref 98–111)
Creatinine, Ser: 1.34 mg/dL — ABNORMAL HIGH (ref 0.61–1.24)
GFR calc Af Amer: >60 mL/min (ref >60)
GFR calc non Af Amer: 56 mL/min — ABNORMAL LOW (ref >60)
Glucose, Bld: 253 mg/dL — ABNORMAL HIGH (ref 70–99)
Potassium: 4.6 mmol/L (ref 3.5–5.1)
Sodium: 131 mmol/L — ABNORMAL LOW (ref 135–145)

## 2019-07-23 LAB — CBC
HCT: 40.2 % (ref 39.0–52.0)
Hemoglobin: 13.1 g/dL (ref 13.0–17.0)
MCH: 30.2 pg (ref 26.0–34.0)
MCHC: 32.6 g/dL (ref 30.0–36.0)
MCV: 92.6 fL (ref 80.0–100.0)
Platelets: 167 10*3/uL (ref 150–400)
RBC: 4.34 MIL/uL (ref 4.22–5.81)
RDW: 13.2 % (ref 11.5–15.5)
WBC: 5.2 10*3/uL (ref 4.0–10.5)
nRBC: 0 % (ref 0.0–0.2)

## 2019-07-23 LAB — TROPONIN I (HIGH SENSITIVITY): Troponin I (High Sensitivity): 4 ng/L (ref <18)

## 2019-07-23 MED ORDER — SODIUM CHLORIDE 0.9% FLUSH: 3.0000 mL | Freq: Once | INTRAVENOUS | Status: DC

## 2019-07-23 NOTE — ED Notes (Signed)
Called for triage X2. No answer.

## 2019-07-23 NOTE — ED Triage Notes (Signed)
Pt arrives to ED w/ c/o 5/10 L sided,non-radiating chest pain and sob that started around 0800 today. Pt also c/o L foot pain. Pt denies cardiac hx.

## 2019-07-24 ENCOUNTER — Emergency Department (HOSPITAL_COMMUNITY): Payer: Medicare HMO

## 2019-07-24 ENCOUNTER — Other Ambulatory Visit: Payer: Self-pay

## 2019-07-24 ENCOUNTER — Other Ambulatory Visit: Payer: Self-pay | Admitting: Medical

## 2019-07-24 ENCOUNTER — Encounter (HOSPITAL_COMMUNITY): Payer: Self-pay | Admitting: Medical

## 2019-07-24 DIAGNOSIS — R079 Chest pain, unspecified: Secondary | ICD-10-CM | POA: Insufficient documentation

## 2019-07-24 DIAGNOSIS — M79672 Pain in left foot: Secondary | ICD-10-CM | POA: Diagnosis not present

## 2019-07-24 DIAGNOSIS — G8929 Other chronic pain: Secondary | ICD-10-CM | POA: Insufficient documentation

## 2019-07-24 DIAGNOSIS — M1711 Unilateral primary osteoarthritis, right knee: Secondary | ICD-10-CM | POA: Diagnosis not present

## 2019-07-24 DIAGNOSIS — M25561 Pain in right knee: Secondary | ICD-10-CM

## 2019-07-24 DIAGNOSIS — R0789 Other chest pain: Secondary | ICD-10-CM | POA: Diagnosis not present

## 2019-07-24 LAB — TROPONIN I (HIGH SENSITIVITY): Troponin I (High Sensitivity): <2 ng/L (ref <18)

## 2019-07-24 MED ORDER — NITROGLYCERIN 0.4 MG SL SUBL
0.4000 mg | SUBLINGUAL_TABLET | Freq: Once | SUBLINGUAL | Status: AC
  Administered 2019-07-24: 0.4 mg via SUBLINGUAL
  Filled 2019-07-24: qty 1

## 2019-07-24 NOTE — ED Notes (Signed)
No response for labs or reassessment

## 2019-07-24 NOTE — ED Notes (Signed)
Pt given dc instructions pt verbalizes understanding.  

## 2019-07-24 NOTE — Consult Note (Signed)
Cardiology Admission History and Physical:   Patient ID: Carlos Shelton MRN: [id number]; DOB: 11/30/1955   Admission date: 07/23/2019  Primary Care Provider: Wenda Low, MD [facility] Cardiologist: [place] Electrophysiologist:  None   Chief Complaint:  Chest pain  Patient Profile:   Carlos Shelton is a 64 y.o. male with pmh of COPD, former smoker, DM2, and epilepsy who is being seen for chest pain.   History of Present Illness:   Mr. Carlos Shelton has not been seen by a cardiologist in the past. No pmh of MI, stent, stroke, HTN. No previous stress tests. He has DM2 and takes a statin. Family history positive for MI in father in his 63s and in his grandfather as well as 2 brothers who died of heart attacks in 29s and 72s. He quit smoking about 5 years ago. Denies alcohol or drug use. He is on disability for his chronic back pain. He regularly follows with PCP.   The patient presented to the ER 07/24/19 for chest pain. The pain started yesterday while he was out and about. He was walking around when he started noticing chest pain and sob. He remembers feeling really sob and then noticed a left sided tightness in his chest. It was more of a gradual onset that eventually got to 6/10. Non-radiating but felt weakness in his left hand. Also felt diaphoretic. No nausea or vomiting. Although he had the chest pain he kept walking because he had another mile to get home. Walking did not seem to make the pain worse. He didn't take anything for the pain. When it didn't resolve he came to the ED.   In the ED he was given SL NTG x 2 which did not help the pain. BP 146/80, pulse 71, afebrile, 99% O2. Labs showed sodium 131, potassium 4.6, glucose 253, creatinine 1.34, WBC 4, Hgb 13.1. HS troponin 4 >2. CXR unremarkable. EKG showed NSR with subtle ST depression inferior leads.Cardiology was consulted  Past Medical History:  Diagnosis Date  . Bronchitis   . Chronic back pain   . COPD  (chronic obstructive pulmonary disease) ([place])   . Diabetes mellitus    type 2  . Epilepsy ([place])   . Migraine   . Shingles   . Wears glasses     Past Surgical History:  Procedure Laterality Date  . LAPAROSCOPIC CHOLECYSTECTOMY  5/46/50   with umbilical hernia repair  . MANDIBLE SURGERY    . TONSILLECTOMY AND ADENOIDECTOMY       Medications Prior to Admission: Prior to Admission medications   Medication Sig Start Date End Date Taking? Authorizing Provider  albuterol (PROVENTIL HFA;VENTOLIN HFA) 108 (90 Base) MCG/ACT inhaler Inhale 1-2 puffs into the lungs every 4 (four) hours as needed for wheezing or shortness of breath.    Yes [provider]  atorvastatin (LIPITOR) 10 MG tablet Take 10 mg by mouth daily.  03/01/17  Yes [provider]  Empagliflozin-metFORMIN HCl ER (SYNJARDY XR) 11-998 MG TB24 Take 1 tablet by mouth in the morning and at bedtime.   Yes [provider]  glimepiride (AMARYL) 2 MG tablet Take 2 mg by mouth daily. 05/16/19  Yes [provider]  omeprazole (PRILOSEC) 20 MG capsule Take 20 mg by mouth daily.  03/01/17  Yes [provider]  rOPINIRole (REQUIP) 1 MG tablet Take 1.5 mg by mouth at bedtime. 07/19/19  Yes [provider]  chlorpheniramine-HYDROcodone (TUSSIONEX) 10-8 MG/5ML SUER Take 5 mLs by mouth every 12 (  twelve) hours as needed for cough. Patient not taking: Reported on 05/23/2018 01/21/17   Rhetta Mura, MD  fluticasone (FLOVENT HFA) 110 MCG/ACT inhaler Inhale 2 puffs into the lungs daily. Patient not taking: Reported on 07/24/2019    [provider]  metFORMIN (GLUCOPHAGE-XR) 500 MG 24 hr tablet Take 1 tablet (500 mg total) by mouth daily with breakfast. 01/22/17   Rhetta Mura, MD  naproxen (NAPROSYN) 375 MG tablet Take 1 tablet (375 mg total) 2 (two) times daily by mouth. Patient not taking: Reported on 05/23/2018 12/14/16   Felicie Morn, NP     Allergies:   No Known  Allergies  Social History:   Social History   Socioeconomic History  . Marital status: Single    Spouse name: Not on file  . Number of children: Not on file  . Years of education: Not on file  . Highest education level: Not on file  Occupational History  . Not on file  Tobacco Use  . Smoking status: Former Smoker    Packs/day: 0.50    Types: Cigarettes    Quit date: 2016    Years since quitting: 5.4  . Smokeless tobacco: Never Used  Substance and Sexual Activity  . Alcohol use: No  . Drug use: No  . Sexual activity: Not on file  Other Topics Concern  . Not on file  Social History Narrative  . Not on file   Social Determinants of Health   Financial Resource Strain:   . Difficulty of Paying Living Expenses:   Food Insecurity:   . Worried About Programme researcher, broadcasting/film/video in the Last Year:   . Barista in the Last Year:   Transportation Needs:   . Freight forwarder (Medical):   Marland Kitchen Lack of Transportation (Non-Medical):   Physical Activity:   . Days of Exercise per Week:   . Minutes of Exercise per Session:   Stress:   . Feeling of Stress :   Social Connections:   . Frequency of Communication with Friends and Family:   . Frequency of Social Gatherings with Friends and Family:   . Attends Religious Services:   . Active Member of Clubs or Organizations:   . Attends Banker Meetings:   Marland Kitchen Marital Status:   Intimate Partner Violence:   . Fear of Current or Ex-Partner:   . Emotionally Abused:   Marland Kitchen Physically Abused:   . Sexually Abused:     Family History:   The patient's family history includes Breast cancer in his mother; COPD in his brother; Cancer in his sister; Diabetes in his mother; Heart attack in his brother and brother; Heart disease in his father.    ROS:  Please see the history of present illness.  All other ROS reviewed and negative.     Physical Exam/Data:   Vitals:   07/23/19 2057 07/24/19 0124 07/24/19 0650 07/24/19 0926  BP:  133/81 127/83 (!) 146/73 (!) 146/80  Pulse: 94 73 68 71  Resp: 18 18 18 20   Temp: 98.6 F (37 C)   98.9 F (37.2 C)  TempSrc: Oral   Oral  SpO2: 97% 97% 100% 99%   No intake or output data in the 24 hours ending 07/24/19 1308 Last 3 Weights 03/25/2017 01/19/2017 01/18/2017  Weight (lbs) 155 lb 149 lb 7.6 oz 140 lb  Weight (kg) 70.308 kg 67.8 kg 63.504 kg     There is no height or weight on file to calculate BMI.  General:  Well nourished, well developed, in no acute distress HEENT: normal Lymph: no adenopathy Neck: no JVD Endocrine:  No thryomegaly Vascular: No carotid bruits; FA pulses 2+ bilaterally without bruits  Cardiac:  normal S1, S2; RRR; no murmur  Lungs:  Diffusely diminished Abd: soft, nontender, no hepatomegaly  Ext: no edema Musculoskeletal:  No deformities, BUE and BLE strength normal and equal Skin: warm and dry  Neuro:  CNs 2-12 intact, no focal abnormalities noted Psych:  Normal affect    EKG:  The ECG that was done 07/24/19 was personally reviewed and demonstrates NSR, HR 93 bpm, subtle ST depression inferior leads, nonspecific T wave changes aVL and V2  Relevant CV Studies:  None  Laboratory Data:  High Sensitivity Troponin:   Recent Labs  Lab 07/23/19 2057 07/24/19 0648  TROPONINIHS 4 <2      Chemistry Recent Labs  Lab 07/23/19 2057  NA 131*  K 4.6  CL 100  CO2 22  GLUCOSE 253*  BUN 18  CREATININE 1.34*  CALCIUM 8.5*  GFRNONAA 56*  GFRAA >60  ANIONGAP 9    No results for input(s): PROT, ALBUMIN, AST, ALT, ALKPHOS, BILITOT in the last 168 hours. Hematology Recent Labs  Lab 07/23/19 2057  WBC 5.2  RBC 4.34  HGB 13.1  HCT 40.2  MCV 92.6  MCH 30.2  MCHC 32.6  RDW 13.2  PLT 167   BNPNo results for input(s): BNP, PROBNP in the last 168 hours.  DDimer No results for input(s): DDIMER in the last 168 hours.   Radiology/Studies:  DG Chest 2 View  Result Date: 07/23/2019 CLINICAL DATA:  Chest pain. EXAM: CHEST - 2 VIEW  COMPARISON:  Jun 21, 2019 FINDINGS: Mild diffuse chronic appearing increased lung markings are seen. There is no evidence of acute infiltrate, pleural effusion or pneumothorax. Mild-to-moderate severity elevation of the left hemidiaphragm is noted. The heart size and mediastinal contours are within normal limits. There is mild calcification of the aortic arch. The visualized skeletal structures are unremarkable. Radiopaque surgical clips are seen overlying the right upper quadrant. IMPRESSION: No active cardiopulmonary disease. Electronically Signed   By: Aram Candela M.D.   On: 07/23/2019 21:32   HEAR Score (for undifferentiated chest pain):  HEAR Score: 4     Assessment and Plan:   Chest pain - typical and atypical features - SL nitro did not help the pain - CP has improved to subtle 3/10 chest discomfort - HS troponin negative x 2 - EKG showed NSR with subtle ST depression inferior leads that appears to be old - RF for CAD include remote tobacco use, DM2, family history - Given RF would consider outpatient stress test. Will discuss with MD  For questions or updates, please contact [facility] Please consult [email or web address] for contact info under     Signed, Willey Due David Stall, PA-C  07/24/2019 1:08 PM

## 2019-07-24 NOTE — ED Provider Notes (Signed)
[facility] EMERGENCY DEPARTMENT Provider Note   CSN: [id number] Arrival date & time: 07/23/19  2031     History Chief Complaint  Patient presents with  . Chest Pain    LANDIN TALLON is a 64 y.o. male history of poor control diabetes, former smoker quit 5 years ago, COPD, seizures, presented to emergency department with chest pain and several other complaints.  Patient reports her primary reason for coming to the ER was began having centralized left sternal chest pressure yesterday.  He has had similar episodes in the past but never lasting this long.  He has active 5/10 chest pain.  He reports no nausea or vomiting.  He does report some paresthesias in his left hand.    He has never seen a cardiologist.  He has no known hx of MI or cardiac stents.  He reports a sig family hx of MI in his father (first MI in his 75's, died at age 7 from heart disease) and his grandfather.  He reports he quit smoking and drinking alcohol 5 years ago.  He is on depakote for seizures, has seizures once per year.  He also reports a "sore" on the bottom of his left foot which is painful with walking, and bad toenails that he cannot cut.  He has some peripheral neuropathy in his toes from diabetes  He says his PCP has been trying to get his diabetes under control but "cant get my a1c down."   HPI  HPI: A 64 year old patient with a history of treated diabetes presents for evaluation of chest pain. Initial onset of pain was less than one hour ago. The patient's chest pain is described as heaviness/pressure/tightness and is not worse with exertion. The patient reports some diaphoresis. The patient's chest pain is middle- or left-sided, is not well-localized, is not sharp and does not radiate to the arms/jaw/neck. The patient does not complain of nausea. The patient has no history of stroke, has no history of peripheral artery disease, has not smoked in the past 90 days, has no relevant  family history of coronary artery disease (first degree relative at less than age 60), is not hypertensive, has no history of hypercholesterolemia and does not have an elevated BMI (>=30).   Past Medical History:  Diagnosis Date  . Bronchitis   . Chronic back pain   . COPD (chronic obstructive pulmonary disease) ([place])   . Diabetes mellitus    type 2  . Epilepsy ([place])   . Migraine   . Shingles   . Wears glasses     Patient Active Problem List   Diagnosis Date Noted  . Chest pain   . Chronic pain of right knee   . Left foot pain   . COPD (chronic obstructive pulmonary disease) ([place]) 01/19/2017  . Chronic back pain 01/19/2017  . Diabetes mellitus type II, non insulin dependent ([place]) 01/19/2017  . DKA (diabetic ketoacidoses) ([place]) 01/19/2017  . Hyponatremia 01/19/2017  . Kidney disease 01/19/2017  . Sepsis due to pneumonia ([place]) 01/18/2017    Past Surgical History:  Procedure Laterality Date  . LAPAROSCOPIC CHOLECYSTECTOMY  2/44/01   with umbilical hernia repair  . MANDIBLE SURGERY    . TONSILLECTOMY AND ADENOIDECTOMY         Family History  Problem Relation Age of Onset  . Breast cancer Mother   . Diabetes Mother   . Heart disease Father   . Heart attack Brother   . Heart attack Brother   .  COPD Brother   . Cancer Sister     Social History   Tobacco Use  . Smoking status: Former Smoker    Packs/day: 0.50    Types: Cigarettes    Quit date: 2016    Years since quitting: 5.4  . Smokeless tobacco: Never Used  Substance Use Topics  . Alcohol use: No  . Drug use: No    Home Medications Prior to Admission medications   Medication Sig Start Date End Date Taking? Authorizing Provider  albuterol (PROVENTIL HFA;VENTOLIN HFA) 108 (90 Base) MCG/ACT inhaler Inhale 1-2 puffs into the lungs every 4 (four) hours as needed for wheezing or shortness of breath.    Yes [provider]  atorvastatin (LIPITOR) 10 MG tablet Take 10 mg by mouth daily.  03/01/17  Yes  [provider]  Empagliflozin-metFORMIN HCl ER (SYNJARDY XR) 11-998 MG TB24 Take 1 tablet by mouth in the morning and at bedtime.   Yes [provider]  glimepiride (AMARYL) 2 MG tablet Take 2 mg by mouth daily. 05/16/19  Yes [provider]  omeprazole (PRILOSEC) 20 MG capsule Take 20 mg by mouth daily.  03/01/17  Yes [provider]  rOPINIRole (REQUIP) 1 MG tablet Take 1.5 mg by mouth at bedtime. 07/19/19  Yes [provider]  chlorpheniramine-HYDROcodone (TUSSIONEX) 10-8 MG/5ML SUER Take 5 mLs by mouth every 12 (twelve) hours as needed for cough. Patient not taking: Reported on 05/23/2018 01/21/17   Rhetta Mura, MD  fluticasone (FLOVENT HFA) 110 MCG/ACT inhaler Inhale 2 puffs into the lungs daily. Patient not taking: Reported on 07/24/2019    [provider]  metFORMIN (GLUCOPHAGE-XR) 500 MG 24 hr tablet Take 1 tablet (500 mg total) by mouth daily with breakfast. 01/22/17   Rhetta Mura, MD  naproxen (NAPROSYN) 375 MG tablet Take 1 tablet (375 mg total) 2 (two) times daily by mouth. Patient not taking: Reported on 05/23/2018 12/14/16   Felicie Morn, NP    Allergies    Patient has no known allergies.  Review of Systems   Review of Systems  Constitutional: Negative for chills and fever.  HENT: Negative for ear pain and sore throat.   Eyes: Negative for pain and visual disturbance.  Respiratory: Negative for cough and shortness of breath.   Cardiovascular: Positive for chest pain. Negative for palpitations.  Gastrointestinal: Negative for abdominal pain and vomiting.  Genitourinary: Negative for dysuria and hematuria.  Musculoskeletal: Positive for arthralgias, joint swelling and myalgias.  Skin: Negative for color change and rash.  Neurological: Negative for syncope and headaches.  Psychiatric/Behavioral: Negative for agitation and confusion.  All other systems reviewed and are negative.   Physical Exam Updated  Vital Signs BP 133/80   Pulse 65   Temp 98 F (36.7 C)   Resp (!) 23   SpO2 100%   Physical Exam Vitals and nursing note reviewed.  Constitutional:      Comments: Thin  HENT:     Head: Normocephalic and atraumatic.  Eyes:     Conjunctiva/sclera: Conjunctivae normal.  Cardiovascular:     Rate and Rhythm: Normal rate and regular rhythm.     Heart sounds: No murmur heard.   Pulmonary:     Effort: Pulmonary effort is normal.     Breath sounds: Normal breath sounds.  Abdominal:     Palpations: Abdomen is soft.     Tenderness: There is no abdominal tenderness.  Musculoskeletal:     Cervical back: Neck supple.     Comments:  Bunion on sole of left foot, tender Elongated toenails, possible fungal nail infection  Skin:    General: Skin is warm and dry.  Neurological:     General: No focal deficit present.     Mental Status: He is alert and oriented to person, place, and time.     ED Results / Procedures / Treatments   Labs (all labs ordered are listed, but only abnormal results are displayed) Labs Reviewed  BASIC METABOLIC PANEL - Abnormal; Notable for the following components:      Result Value   Sodium 131 (*)    Glucose, Bld 253 (*)    Creatinine, Ser 1.34 (*)    Calcium 8.5 (*)    GFR calc non Af Amer 56 (*)    All other components within normal limits  CBC  TROPONIN I (HIGH SENSITIVITY)  TROPONIN I (HIGH SENSITIVITY)    EKG EKG Interpretation  Date/Time:  Monday July 23 2019 20:53:57 EDT Ventricular Rate:  93 PR Interval:  152 QRS Duration: 84 QT Interval:  346 QTC Calculation: 430 R Axis:   65 Text Interpretation: Normal sinus rhythm Possible Left atrial enlargement Septal infarct , age undetermined No STEMI Confirmed by Alvester Chou [phone]) on 07/24/2019 10:32:48 AM   Radiology DG Chest 2 View  Result Date: 07/23/2019 CLINICAL DATA:  Chest pain. EXAM: CHEST - 2 VIEW COMPARISON:  Jun 21, 2019 FINDINGS: Mild diffuse chronic appearing increased lung  markings are seen. There is no evidence of acute infiltrate, pleural effusion or pneumothorax. Mild-to-moderate severity elevation of the left hemidiaphragm is noted. The heart size and mediastinal contours are within normal limits. There is mild calcification of the aortic arch. The visualized skeletal structures are unremarkable. Radiopaque surgical clips are seen overlying the right upper quadrant. IMPRESSION: No active cardiopulmonary disease. Electronically Signed   By: Aram Candela M.D.   On: 07/23/2019 21:32   DG Knee 2 Views Right  Result Date: 07/24/2019 CLINICAL DATA:  Knee pain. EXAM: RIGHT KNEE - 1-2 VIEW COMPARISON:  No prior. FINDINGS: Small exostosis noted of the proximal fibula. No acute bony abnormality. No evidence of fracture or dislocation. Mild medial compartment degenerative change. Small loose bodies cannot be excluded. Peripheral vascular calcification. IMPRESSION: 1.  Small exostosis noted the proximal fibula. 2. Mild medial compartment degenerative change. Small loose bodies cannot be excluded. No acute bony abnormality. 3.  Peripheral vascular disease. Electronically Signed   By: Maisie Fus  Register   On: 07/24/2019 15:05    Procedures Procedures (including critical care time)  Medications Ordered in ED Medications  nitroGLYCERIN (NITROSTAT) SL tablet 0.4 mg (0.4 mg Sublingual Given 07/24/19 1120)    ED Course  I have reviewed the triage vital signs and the nursing notes.  Pertinent labs & imaging results that were available during my care of the patient were reviewed by me and considered in my medical decision making (see chart for details).  64 yo male here with chest pressure for 2 days  DDx includes ACS vs gastritis vs. PTX vs PNA vs other  No hypoxia or tachycardia, doubt PE.  Labs here reviewed with trop undetectable <2, BMP near baseline levels for him, CBC unremarkable, DG chest personally reviewed without evidence of infiltrate or PTX ECG per my  interpretation with NSR, nonischemic  Patient has been observed for ~12-14 hours since his arrival with no dynamic changes on tele   Cardiology was consulted as noted by Dr Blanchie Dessert note, and had recommended outpatient Myoview stress testing in  the office later this week.  He was noted to have a reassuring cardiac workup in the ER, although he did have several family risk factors.  The patient was noted to be chest pain free at the time of their evaluation.  I agree with the cardiologist's recommendations for close outpatient management.  At this time I have a lower suspicion for PE, PTX, PNA or other imminently life-threatening cause of chest pain.  An xray of the knee was obtained as the patient had complained of right knee "buckling" for months.  There is no fracture here.  I suspect he is favoring this leg given his sores on his left foot, and advised a compression or sports sleeve OTC for stability.  His left foot would benefit from podiatric evaluation.  He has some tender bunions making it difficult to bear weight, as well as diabetic neuropathy, and his toe nails appear to have a fungal infection.  I will provide him with this information for outpatient follow up.  Clinical Course as of Jul 24 908  Tue Jul 24, 2019  1422 Patient seen by Dr Rennis Golden from cardiology, who felt he was appropriate for outpatient close follow up and stress testing given his risk factors.  Dr Rennis Golden tells me the patient informed him that, "Telling them I have chest pain is the only way to get seen around here," and remains fixated on his foot and toe pain.  These are nonemergnt conditions that can be managed by a podiatrist.   [MT]    Clinical Course User Index [MT] Rilla Buckman, Kermit Balo, MD    Final Clinical Impression(s) / ED Diagnoses Final diagnoses:  Chest pain, unspecified type  Left foot pain  Chronic pain of right knee    Rx / DC Orders ED Discharge Orders    None       Terald Sleeper,  MD 07/25/19 [phone]

## 2019-07-24 NOTE — Discharge Instructions (Addendum)
The cardiologists will have their office contact you about setting up close follow up, and an outpatient stress test.  In the meantime, you have worsening chest pain, or lightheadedness, or difficulty breathing, please return to the ER.  For your foot issues (toenails and bunyon/sores on foot), please call the [facility]), [address] [place] [address] at [phone] for an appointment.

## 2019-08-01 ENCOUNTER — Other Ambulatory Visit: Payer: Self-pay | Admitting: Medical

## 2019-08-01 DIAGNOSIS — R079 Chest pain, unspecified: Secondary | ICD-10-CM

## 2019-08-13 ENCOUNTER — Encounter (HOSPITAL_COMMUNITY): Payer: Self-pay | Admitting: Medical

## 2019-08-27 ENCOUNTER — Telehealth (HOSPITAL_COMMUNITY): Payer: Self-pay | Admitting: Medical

## 2019-08-27 DIAGNOSIS — I700 Atherosclerosis of aorta: Secondary | ICD-10-CM | POA: Diagnosis not present

## 2019-08-27 DIAGNOSIS — E1165 Type 2 diabetes mellitus with hyperglycemia: Secondary | ICD-10-CM | POA: Diagnosis not present

## 2019-08-27 DIAGNOSIS — Z7984 Long term (current) use of oral hypoglycemic drugs: Secondary | ICD-10-CM | POA: Diagnosis not present

## 2019-08-27 DIAGNOSIS — G2581 Restless legs syndrome: Secondary | ICD-10-CM | POA: Diagnosis not present

## 2019-08-27 DIAGNOSIS — J449 Chronic obstructive pulmonary disease, unspecified: Secondary | ICD-10-CM | POA: Diagnosis not present

## 2019-08-27 DIAGNOSIS — E7800 Pure hypercholesterolemia, unspecified: Secondary | ICD-10-CM | POA: Diagnosis not present

## 2019-08-27 NOTE — Telephone Encounter (Signed)
Just an FYI. We have made several attempts to contact this patient including sending a letter to schedule or reschedule their Myocardial Perfusion test. We will be removing the patient from the Nuclear [facility].   08/13/2019 MAILED LETTER [facility]  08/10/19 [facility] to schedule @ 11:41/LBW  08/07/19 [facility] to schedule @ 9:35/LBW  08/01/19 [facility] to schedule @ 2:02/LBW        Thank you

## 2019-12-21 DIAGNOSIS — E1165 Type 2 diabetes mellitus with hyperglycemia: Secondary | ICD-10-CM | POA: Diagnosis not present

## 2019-12-21 DIAGNOSIS — Z1389 Encounter for screening for other disorder: Secondary | ICD-10-CM | POA: Diagnosis not present

## 2019-12-21 DIAGNOSIS — E7800 Pure hypercholesterolemia, unspecified: Secondary | ICD-10-CM | POA: Diagnosis not present

## 2019-12-21 DIAGNOSIS — J449 Chronic obstructive pulmonary disease, unspecified: Secondary | ICD-10-CM | POA: Diagnosis not present

## 2019-12-21 DIAGNOSIS — Z23 Encounter for immunization: Secondary | ICD-10-CM | POA: Diagnosis not present

## 2019-12-21 DIAGNOSIS — G2581 Restless legs syndrome: Secondary | ICD-10-CM | POA: Diagnosis not present

## 2019-12-21 DIAGNOSIS — Z125 Encounter for screening for malignant neoplasm of prostate: Secondary | ICD-10-CM | POA: Diagnosis not present

## 2020-01-29 ENCOUNTER — Telehealth: Payer: Self-pay

## 2020-01-29 NOTE — Telephone Encounter (Signed)
Per Cadence Fransico Michael, PA-C called pt to follow up with him about nuclear stress test and follow up visit that was meant to take place after he was discharged from the hospital in June 2021 but never took place. Left message to call back.

## 2020-07-02 DIAGNOSIS — I700 Atherosclerosis of aorta: Secondary | ICD-10-CM | POA: Diagnosis not present

## 2020-07-02 DIAGNOSIS — E7800 Pure hypercholesterolemia, unspecified: Secondary | ICD-10-CM | POA: Diagnosis not present

## 2020-07-02 DIAGNOSIS — G2581 Restless legs syndrome: Secondary | ICD-10-CM | POA: Diagnosis not present

## 2020-07-02 DIAGNOSIS — E1165 Type 2 diabetes mellitus with hyperglycemia: Secondary | ICD-10-CM | POA: Diagnosis not present

## 2020-07-02 DIAGNOSIS — J449 Chronic obstructive pulmonary disease, unspecified: Secondary | ICD-10-CM | POA: Diagnosis not present

## 2020-10-04 IMAGING — CR DG KNEE 1-2V*R*
2 series · 2 of 2 positions shown · non-contrast
Comparison: No prior.

CLINICAL DATA: Knee pain.

EXAM:
RIGHT KNEE - 1-2 VIEW

[knee ap]
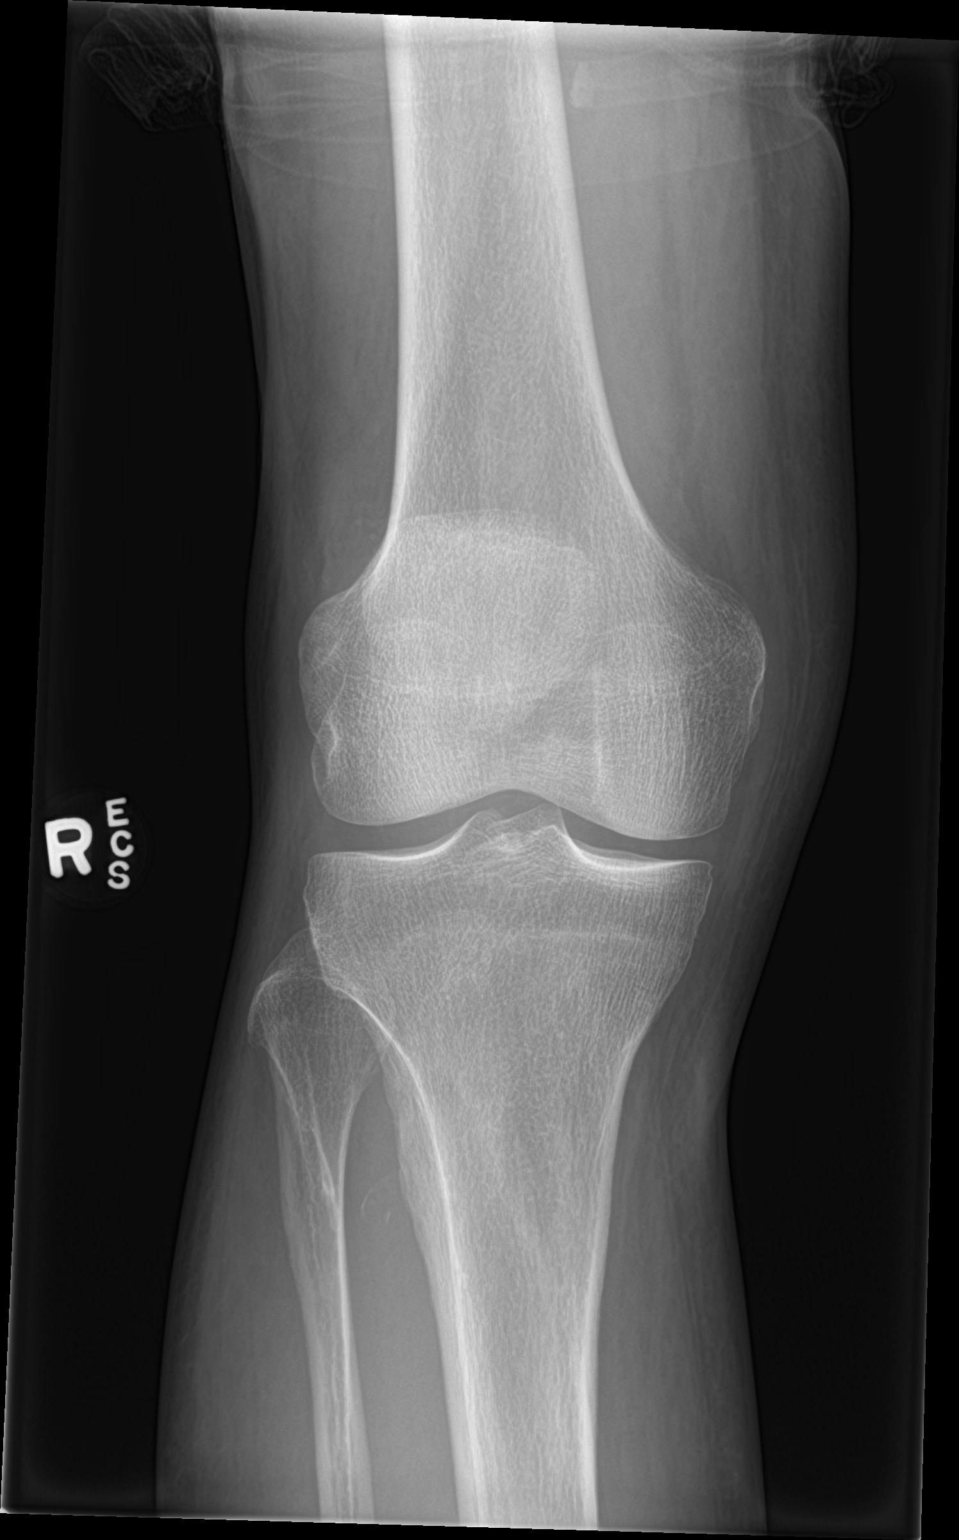

[knee lat]
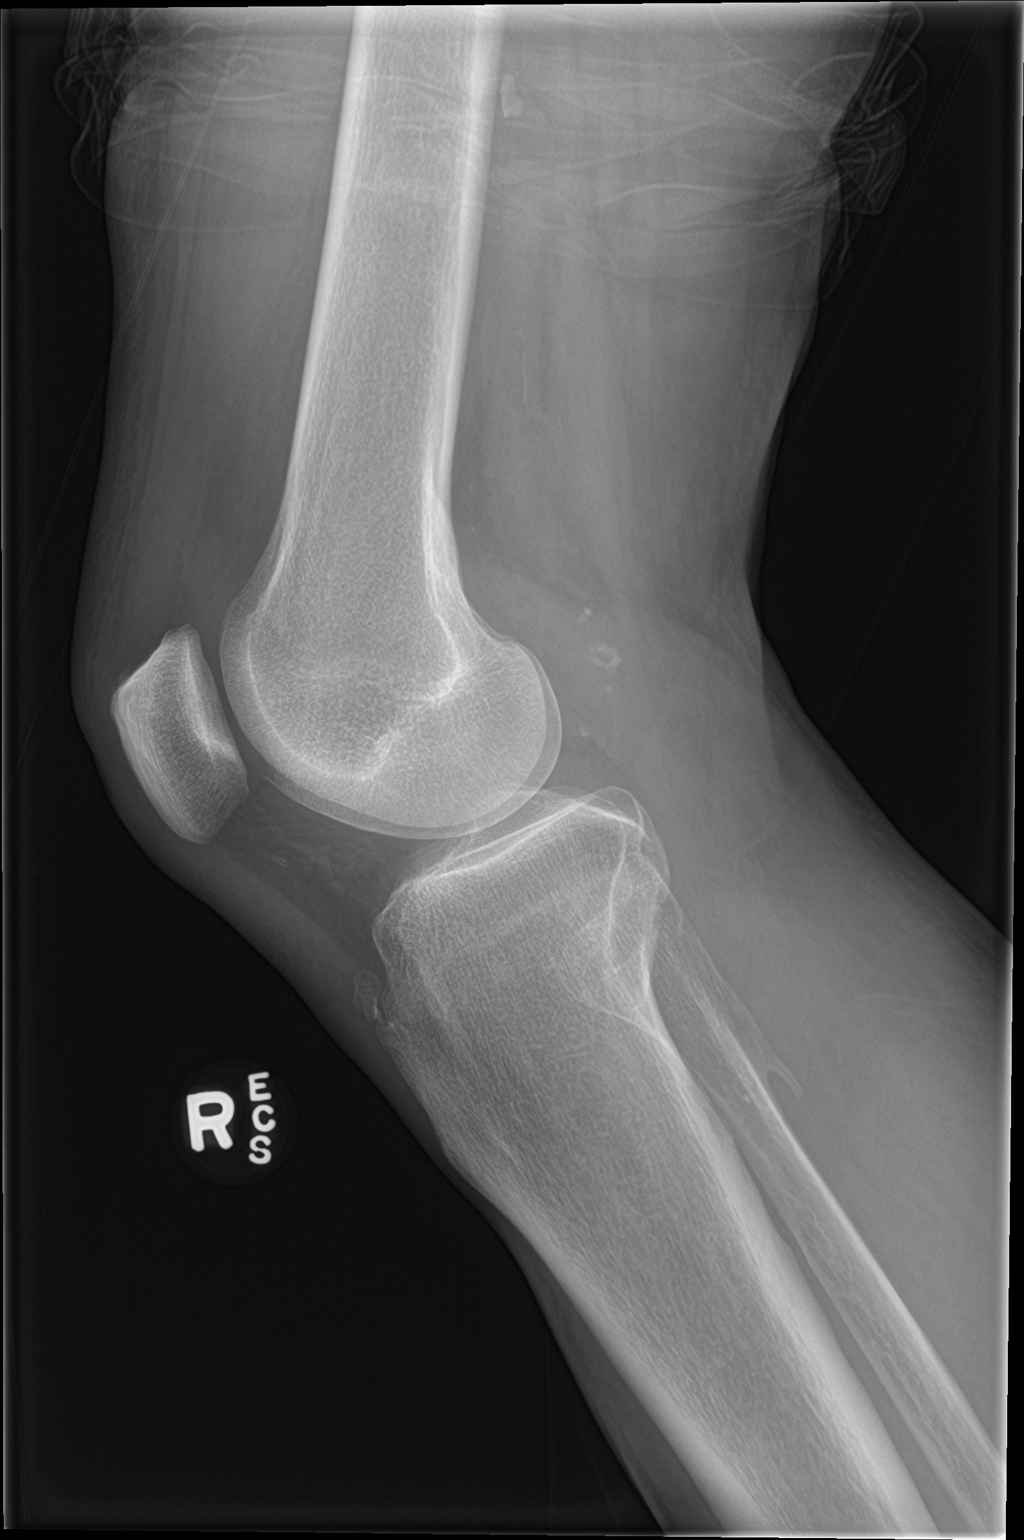

[2 of 2 positions shown; findings below may reference images not displayed]

FINDINGS: Small exostosis noted of the proximal fibula. No acute bony
abnormality. No evidence of fracture or dislocation. Mild medial
compartment degenerative change. Small loose bodies cannot be
excluded. Peripheral vascular calcification.
IMPRESSION: 1.  Small exostosis noted the proximal fibula.

2. Mild medial compartment degenerative change. Small loose bodies
cannot be excluded. No acute bony abnormality.

3.  Peripheral vascular disease.

## 2021-01-30 DIAGNOSIS — H547 Unspecified visual loss: Secondary | ICD-10-CM | POA: Diagnosis not present

## 2021-01-30 DIAGNOSIS — R739 Hyperglycemia, unspecified: Secondary | ICD-10-CM | POA: Diagnosis not present

## 2021-01-30 DIAGNOSIS — H4311 Vitreous hemorrhage, right eye: Secondary | ICD-10-CM | POA: Diagnosis not present

## 2021-01-30 DIAGNOSIS — I10 Essential (primary) hypertension: Secondary | ICD-10-CM | POA: Diagnosis not present

## 2021-02-20 DIAGNOSIS — H5462 Unqualified visual loss, left eye, normal vision right eye: Secondary | ICD-10-CM | POA: Diagnosis not present

## 2021-02-20 DIAGNOSIS — E1136 Type 2 diabetes mellitus with diabetic cataract: Secondary | ICD-10-CM | POA: Diagnosis not present

## 2021-02-20 DIAGNOSIS — E113513 Type 2 diabetes mellitus with proliferative diabetic retinopathy with macular edema, bilateral: Secondary | ICD-10-CM | POA: Diagnosis not present

## 2021-02-20 DIAGNOSIS — H268 Other specified cataract: Secondary | ICD-10-CM | POA: Diagnosis not present

## 2021-02-20 DIAGNOSIS — E113593 Type 2 diabetes mellitus with proliferative diabetic retinopathy without macular edema, bilateral: Secondary | ICD-10-CM | POA: Diagnosis not present

## 2021-02-20 DIAGNOSIS — H4311 Vitreous hemorrhage, right eye: Secondary | ICD-10-CM | POA: Diagnosis not present

## 2021-08-01 DIAGNOSIS — H5461 Unqualified visual loss, right eye, normal vision left eye: Secondary | ICD-10-CM | POA: Diagnosis not present

## 2021-08-01 DIAGNOSIS — J449 Chronic obstructive pulmonary disease, unspecified: Secondary | ICD-10-CM | POA: Diagnosis not present

## 2021-08-01 DIAGNOSIS — H538 Other visual disturbances: Secondary | ICD-10-CM | POA: Diagnosis not present

## 2021-08-01 DIAGNOSIS — H547 Unspecified visual loss: Secondary | ICD-10-CM | POA: Diagnosis not present

## 2021-08-01 DIAGNOSIS — H43811 Vitreous degeneration, right eye: Secondary | ICD-10-CM | POA: Diagnosis not present

## 2021-08-01 DIAGNOSIS — W010XXA Fall on same level from slipping, tripping and stumbling without subsequent striking against object, initial encounter: Secondary | ICD-10-CM | POA: Diagnosis not present

## 2021-08-01 DIAGNOSIS — M545 Low back pain, unspecified: Secondary | ICD-10-CM | POA: Diagnosis not present

## 2021-08-01 DIAGNOSIS — Z7984 Long term (current) use of oral hypoglycemic drugs: Secondary | ICD-10-CM | POA: Diagnosis not present

## 2021-08-01 DIAGNOSIS — R519 Headache, unspecified: Secondary | ICD-10-CM | POA: Diagnosis not present

## 2021-08-01 DIAGNOSIS — H269 Unspecified cataract: Secondary | ICD-10-CM | POA: Diagnosis not present

## 2021-08-01 DIAGNOSIS — H4311 Vitreous hemorrhage, right eye: Secondary | ICD-10-CM | POA: Diagnosis not present

## 2021-08-01 DIAGNOSIS — Z9049 Acquired absence of other specified parts of digestive tract: Secondary | ICD-10-CM | POA: Diagnosis not present

## 2021-08-01 DIAGNOSIS — K3580 Unspecified acute appendicitis: Secondary | ICD-10-CM | POA: Diagnosis not present

## 2021-08-01 DIAGNOSIS — S0590XA Unspecified injury of unspecified eye and orbit, initial encounter: Secondary | ICD-10-CM | POA: Diagnosis not present

## 2021-08-01 DIAGNOSIS — H579 Unspecified disorder of eye and adnexa: Secondary | ICD-10-CM | POA: Diagnosis not present

## 2021-08-01 DIAGNOSIS — Y998 Other external cause status: Secondary | ICD-10-CM | POA: Diagnosis not present

## 2021-08-01 DIAGNOSIS — Z743 Need for continuous supervision: Secondary | ICD-10-CM | POA: Diagnosis not present

## 2021-08-01 DIAGNOSIS — H539 Unspecified visual disturbance: Secondary | ICD-10-CM | POA: Diagnosis not present

## 2021-08-01 DIAGNOSIS — E113593 Type 2 diabetes mellitus with proliferative diabetic retinopathy without macular edema, bilateral: Secondary | ICD-10-CM | POA: Diagnosis not present

## 2021-08-01 DIAGNOSIS — R1031 Right lower quadrant pain: Secondary | ICD-10-CM | POA: Diagnosis not present

## 2021-08-01 DIAGNOSIS — E1136 Type 2 diabetes mellitus with diabetic cataract: Secondary | ICD-10-CM | POA: Diagnosis not present

## 2021-08-01 DIAGNOSIS — K37 Unspecified appendicitis: Secondary | ICD-10-CM | POA: Diagnosis not present

## 2021-08-07 DIAGNOSIS — H5462 Unqualified visual loss, left eye, normal vision right eye: Secondary | ICD-10-CM | POA: Diagnosis not present

## 2021-08-07 DIAGNOSIS — H4311 Vitreous hemorrhage, right eye: Secondary | ICD-10-CM | POA: Diagnosis not present

## 2021-08-07 DIAGNOSIS — H268 Other specified cataract: Secondary | ICD-10-CM | POA: Diagnosis not present

## 2021-08-27 DIAGNOSIS — E1136 Type 2 diabetes mellitus with diabetic cataract: Secondary | ICD-10-CM | POA: Diagnosis not present

## 2021-08-27 DIAGNOSIS — H4311 Vitreous hemorrhage, right eye: Secondary | ICD-10-CM | POA: Diagnosis not present

## 2021-08-27 DIAGNOSIS — E113551 Type 2 diabetes mellitus with stable proliferative diabetic retinopathy, right eye: Secondary | ICD-10-CM | POA: Diagnosis not present

## 2021-08-27 DIAGNOSIS — H2511 Age-related nuclear cataract, right eye: Secondary | ICD-10-CM | POA: Diagnosis not present

## 2021-08-27 DIAGNOSIS — H3581 Retinal edema: Secondary | ICD-10-CM | POA: Diagnosis not present

## 2021-08-27 DIAGNOSIS — Z7984 Long term (current) use of oral hypoglycemic drugs: Secondary | ICD-10-CM | POA: Diagnosis not present

## 2021-10-14 DIAGNOSIS — K5900 Constipation, unspecified: Secondary | ICD-10-CM | POA: Diagnosis not present

## 2021-10-14 DIAGNOSIS — K219 Gastro-esophageal reflux disease without esophagitis: Secondary | ICD-10-CM | POA: Diagnosis not present

## 2021-10-14 DIAGNOSIS — E1136 Type 2 diabetes mellitus with diabetic cataract: Secondary | ICD-10-CM | POA: Diagnosis not present

## 2021-10-14 DIAGNOSIS — E113541 Type 2 diabetes mellitus with proliferative diabetic retinopathy with combined traction retinal detachment and rhegmatogenous retinal detachment, right eye: Secondary | ICD-10-CM | POA: Diagnosis not present

## 2021-10-14 DIAGNOSIS — H4311 Vitreous hemorrhage, right eye: Secondary | ICD-10-CM | POA: Diagnosis not present

## 2021-10-14 DIAGNOSIS — H33011 Retinal detachment with single break, right eye: Secondary | ICD-10-CM | POA: Diagnosis not present

## 2021-10-14 DIAGNOSIS — H3341 Traction detachment of retina, right eye: Secondary | ICD-10-CM | POA: Diagnosis not present

## 2021-10-14 DIAGNOSIS — Z7984 Long term (current) use of oral hypoglycemic drugs: Secondary | ICD-10-CM | POA: Diagnosis not present

## 2021-10-14 DIAGNOSIS — H2511 Age-related nuclear cataract, right eye: Secondary | ICD-10-CM | POA: Diagnosis not present

## 2021-10-14 DIAGNOSIS — J449 Chronic obstructive pulmonary disease, unspecified: Secondary | ICD-10-CM | POA: Diagnosis not present

## 2021-10-15 DIAGNOSIS — E1136 Type 2 diabetes mellitus with diabetic cataract: Secondary | ICD-10-CM | POA: Diagnosis not present

## 2021-10-15 DIAGNOSIS — K5900 Constipation, unspecified: Secondary | ICD-10-CM | POA: Diagnosis not present

## 2021-10-15 DIAGNOSIS — H4311 Vitreous hemorrhage, right eye: Secondary | ICD-10-CM | POA: Diagnosis not present

## 2021-10-15 DIAGNOSIS — H33011 Retinal detachment with single break, right eye: Secondary | ICD-10-CM | POA: Diagnosis not present

## 2021-10-15 DIAGNOSIS — J449 Chronic obstructive pulmonary disease, unspecified: Secondary | ICD-10-CM | POA: Diagnosis not present

## 2021-10-15 DIAGNOSIS — K219 Gastro-esophageal reflux disease without esophagitis: Secondary | ICD-10-CM | POA: Diagnosis not present

## 2021-10-15 DIAGNOSIS — H2511 Age-related nuclear cataract, right eye: Secondary | ICD-10-CM | POA: Diagnosis not present

## 2021-10-15 DIAGNOSIS — Z7984 Long term (current) use of oral hypoglycemic drugs: Secondary | ICD-10-CM | POA: Diagnosis not present

## 2021-10-16 DIAGNOSIS — H33011 Retinal detachment with single break, right eye: Secondary | ICD-10-CM | POA: Diagnosis not present

## 2021-10-16 DIAGNOSIS — J449 Chronic obstructive pulmonary disease, unspecified: Secondary | ICD-10-CM | POA: Diagnosis not present

## 2021-10-16 DIAGNOSIS — R1031 Right lower quadrant pain: Secondary | ICD-10-CM | POA: Diagnosis not present

## 2021-10-16 DIAGNOSIS — K5900 Constipation, unspecified: Secondary | ICD-10-CM | POA: Diagnosis not present

## 2021-10-16 DIAGNOSIS — N179 Acute kidney failure, unspecified: Secondary | ICD-10-CM | POA: Diagnosis not present

## 2021-10-16 DIAGNOSIS — H4311 Vitreous hemorrhage, right eye: Secondary | ICD-10-CM | POA: Diagnosis not present

## 2021-10-16 DIAGNOSIS — E1136 Type 2 diabetes mellitus with diabetic cataract: Secondary | ICD-10-CM | POA: Diagnosis not present

## 2021-10-16 DIAGNOSIS — H2511 Age-related nuclear cataract, right eye: Secondary | ICD-10-CM | POA: Diagnosis not present

## 2021-10-16 DIAGNOSIS — K219 Gastro-esophageal reflux disease without esophagitis: Secondary | ICD-10-CM | POA: Diagnosis not present

## 2021-10-16 DIAGNOSIS — Z7984 Long term (current) use of oral hypoglycemic drugs: Secondary | ICD-10-CM | POA: Diagnosis not present

## 2021-10-16 DIAGNOSIS — K37 Unspecified appendicitis: Secondary | ICD-10-CM | POA: Diagnosis not present

## 2021-10-17 DIAGNOSIS — Z7984 Long term (current) use of oral hypoglycemic drugs: Secondary | ICD-10-CM | POA: Diagnosis not present

## 2021-10-17 DIAGNOSIS — H4311 Vitreous hemorrhage, right eye: Secondary | ICD-10-CM | POA: Diagnosis not present

## 2021-10-17 DIAGNOSIS — H33011 Retinal detachment with single break, right eye: Secondary | ICD-10-CM | POA: Diagnosis not present

## 2021-10-17 DIAGNOSIS — K219 Gastro-esophageal reflux disease without esophagitis: Secondary | ICD-10-CM | POA: Diagnosis not present

## 2021-10-17 DIAGNOSIS — K5900 Constipation, unspecified: Secondary | ICD-10-CM | POA: Diagnosis not present

## 2021-10-17 DIAGNOSIS — J449 Chronic obstructive pulmonary disease, unspecified: Secondary | ICD-10-CM | POA: Diagnosis not present

## 2021-10-17 DIAGNOSIS — E1136 Type 2 diabetes mellitus with diabetic cataract: Secondary | ICD-10-CM | POA: Diagnosis not present

## 2021-10-17 DIAGNOSIS — H2511 Age-related nuclear cataract, right eye: Secondary | ICD-10-CM | POA: Diagnosis not present

## 2021-10-18 DIAGNOSIS — Z9889 Other specified postprocedural states: Secondary | ICD-10-CM | POA: Diagnosis not present

## 2021-10-18 DIAGNOSIS — H2511 Age-related nuclear cataract, right eye: Secondary | ICD-10-CM | POA: Diagnosis not present

## 2021-10-18 DIAGNOSIS — H33051 Total retinal detachment, right eye: Secondary | ICD-10-CM | POA: Diagnosis not present

## 2021-10-18 DIAGNOSIS — E113521 Type 2 diabetes mellitus with proliferative diabetic retinopathy with traction retinal detachment involving the macula, right eye: Secondary | ICD-10-CM | POA: Diagnosis not present

## 2021-10-18 DIAGNOSIS — H4311 Vitreous hemorrhage, right eye: Secondary | ICD-10-CM | POA: Diagnosis not present

## 2021-10-18 DIAGNOSIS — J449 Chronic obstructive pulmonary disease, unspecified: Secondary | ICD-10-CM | POA: Diagnosis not present

## 2021-10-19 DIAGNOSIS — H548 Legal blindness, as defined in USA: Secondary | ICD-10-CM | POA: Diagnosis not present

## 2021-10-19 DIAGNOSIS — Z9889 Other specified postprocedural states: Secondary | ICD-10-CM | POA: Diagnosis not present

## 2021-10-19 DIAGNOSIS — R569 Unspecified convulsions: Secondary | ICD-10-CM | POA: Diagnosis not present

## 2021-10-19 DIAGNOSIS — H2511 Age-related nuclear cataract, right eye: Secondary | ICD-10-CM | POA: Diagnosis not present

## 2021-10-19 DIAGNOSIS — E1139 Type 2 diabetes mellitus with other diabetic ophthalmic complication: Secondary | ICD-10-CM | POA: Diagnosis not present

## 2021-10-19 DIAGNOSIS — J449 Chronic obstructive pulmonary disease, unspecified: Secondary | ICD-10-CM | POA: Diagnosis not present

## 2021-10-19 DIAGNOSIS — E113521 Type 2 diabetes mellitus with proliferative diabetic retinopathy with traction retinal detachment involving the macula, right eye: Secondary | ICD-10-CM | POA: Diagnosis not present

## 2021-10-19 DIAGNOSIS — H4311 Vitreous hemorrhage, right eye: Secondary | ICD-10-CM | POA: Diagnosis not present

## 2021-10-19 DIAGNOSIS — H33051 Total retinal detachment, right eye: Secondary | ICD-10-CM | POA: Diagnosis not present

## 2021-10-20 DIAGNOSIS — J449 Chronic obstructive pulmonary disease, unspecified: Secondary | ICD-10-CM | POA: Diagnosis not present

## 2021-10-20 DIAGNOSIS — Z9889 Other specified postprocedural states: Secondary | ICD-10-CM | POA: Diagnosis not present

## 2021-10-20 DIAGNOSIS — H4311 Vitreous hemorrhage, right eye: Secondary | ICD-10-CM | POA: Diagnosis not present

## 2021-10-20 DIAGNOSIS — H33051 Total retinal detachment, right eye: Secondary | ICD-10-CM | POA: Diagnosis not present

## 2021-10-20 DIAGNOSIS — H2511 Age-related nuclear cataract, right eye: Secondary | ICD-10-CM | POA: Diagnosis not present

## 2021-10-20 DIAGNOSIS — E113521 Type 2 diabetes mellitus with proliferative diabetic retinopathy with traction retinal detachment involving the macula, right eye: Secondary | ICD-10-CM | POA: Diagnosis not present

## 2021-10-21 DIAGNOSIS — N178 Other acute kidney failure: Secondary | ICD-10-CM | POA: Diagnosis not present

## 2021-10-21 DIAGNOSIS — H548 Legal blindness, as defined in USA: Secondary | ICD-10-CM | POA: Diagnosis not present

## 2021-10-21 DIAGNOSIS — E113521 Type 2 diabetes mellitus with proliferative diabetic retinopathy with traction retinal detachment involving the macula, right eye: Secondary | ICD-10-CM | POA: Diagnosis not present

## 2021-10-21 DIAGNOSIS — H4311 Vitreous hemorrhage, right eye: Secondary | ICD-10-CM | POA: Diagnosis not present

## 2021-10-21 DIAGNOSIS — Z9889 Other specified postprocedural states: Secondary | ICD-10-CM | POA: Diagnosis not present

## 2021-10-21 DIAGNOSIS — J449 Chronic obstructive pulmonary disease, unspecified: Secondary | ICD-10-CM | POA: Diagnosis not present

## 2021-10-21 DIAGNOSIS — K5904 Chronic idiopathic constipation: Secondary | ICD-10-CM | POA: Diagnosis not present

## 2021-10-21 DIAGNOSIS — H33051 Total retinal detachment, right eye: Secondary | ICD-10-CM | POA: Diagnosis not present

## 2021-10-21 DIAGNOSIS — E1139 Type 2 diabetes mellitus with other diabetic ophthalmic complication: Secondary | ICD-10-CM | POA: Diagnosis not present

## 2021-10-21 DIAGNOSIS — H2511 Age-related nuclear cataract, right eye: Secondary | ICD-10-CM | POA: Diagnosis not present

## 2021-10-21 DIAGNOSIS — K219 Gastro-esophageal reflux disease without esophagitis: Secondary | ICD-10-CM | POA: Diagnosis not present

## 2021-10-22 DIAGNOSIS — H4311 Vitreous hemorrhage, right eye: Secondary | ICD-10-CM | POA: Diagnosis not present

## 2021-10-22 DIAGNOSIS — Z79899 Other long term (current) drug therapy: Secondary | ICD-10-CM | POA: Diagnosis not present

## 2021-10-22 DIAGNOSIS — E113551 Type 2 diabetes mellitus with stable proliferative diabetic retinopathy, right eye: Secondary | ICD-10-CM | POA: Diagnosis not present

## 2021-10-22 DIAGNOSIS — Z7984 Long term (current) use of oral hypoglycemic drugs: Secondary | ICD-10-CM | POA: Diagnosis not present

## 2021-10-22 DIAGNOSIS — H2511 Age-related nuclear cataract, right eye: Secondary | ICD-10-CM | POA: Diagnosis not present

## 2021-10-22 DIAGNOSIS — H3581 Retinal edema: Secondary | ICD-10-CM | POA: Diagnosis not present

## 2021-10-22 DIAGNOSIS — E1136 Type 2 diabetes mellitus with diabetic cataract: Secondary | ICD-10-CM | POA: Diagnosis not present

## 2021-11-05 DIAGNOSIS — Z48810 Encounter for surgical aftercare following surgery on the sense organs: Secondary | ICD-10-CM | POA: Diagnosis not present

## 2021-11-05 DIAGNOSIS — K219 Gastro-esophageal reflux disease without esophagitis: Secondary | ICD-10-CM | POA: Diagnosis not present

## 2021-11-05 DIAGNOSIS — Z9841 Cataract extraction status, right eye: Secondary | ICD-10-CM | POA: Diagnosis not present

## 2021-11-05 DIAGNOSIS — H4311 Vitreous hemorrhage, right eye: Secondary | ICD-10-CM | POA: Diagnosis not present

## 2021-11-05 DIAGNOSIS — R2689 Other abnormalities of gait and mobility: Secondary | ICD-10-CM | POA: Diagnosis not present

## 2021-11-05 DIAGNOSIS — Z23 Encounter for immunization: Secondary | ICD-10-CM | POA: Diagnosis not present

## 2021-11-05 DIAGNOSIS — R627 Adult failure to thrive: Secondary | ICD-10-CM | POA: Diagnosis not present

## 2021-11-05 DIAGNOSIS — R2681 Unsteadiness on feet: Secondary | ICD-10-CM | POA: Diagnosis not present

## 2021-11-05 DIAGNOSIS — R296 Repeated falls: Secondary | ICD-10-CM | POA: Diagnosis not present

## 2021-11-05 DIAGNOSIS — Z79899 Other long term (current) drug therapy: Secondary | ICD-10-CM | POA: Diagnosis not present

## 2021-11-05 DIAGNOSIS — H3581 Retinal edema: Secondary | ICD-10-CM | POA: Diagnosis not present

## 2021-11-05 DIAGNOSIS — E083541 Diabetes mellitus due to underlying condition with proliferative diabetic retinopathy with combined traction retinal detachment and rhegmatogenous retinal detachment, right eye: Secondary | ICD-10-CM | POA: Diagnosis not present

## 2021-11-05 DIAGNOSIS — E113551 Type 2 diabetes mellitus with stable proliferative diabetic retinopathy, right eye: Secondary | ICD-10-CM | POA: Diagnosis not present

## 2021-11-05 DIAGNOSIS — H548 Legal blindness, as defined in USA: Secondary | ICD-10-CM | POA: Diagnosis not present

## 2021-11-05 DIAGNOSIS — Z9889 Other specified postprocedural states: Secondary | ICD-10-CM | POA: Diagnosis not present

## 2021-11-05 DIAGNOSIS — H2512 Age-related nuclear cataract, left eye: Secondary | ICD-10-CM | POA: Diagnosis not present

## 2021-11-05 DIAGNOSIS — H4301 Vitreous prolapse, right eye: Secondary | ICD-10-CM | POA: Diagnosis not present

## 2021-11-05 DIAGNOSIS — K5904 Chronic idiopathic constipation: Secondary | ICD-10-CM | POA: Diagnosis not present

## 2021-11-05 DIAGNOSIS — R54 Age-related physical debility: Secondary | ICD-10-CM | POA: Diagnosis not present

## 2021-11-05 DIAGNOSIS — Z7984 Long term (current) use of oral hypoglycemic drugs: Secondary | ICD-10-CM | POA: Diagnosis not present

## 2021-11-05 DIAGNOSIS — M6281 Muscle weakness (generalized): Secondary | ICD-10-CM | POA: Diagnosis not present

## 2021-11-05 DIAGNOSIS — E119 Type 2 diabetes mellitus without complications: Secondary | ICD-10-CM | POA: Diagnosis not present

## 2021-11-05 DIAGNOSIS — E1137X1 Type 2 diabetes mellitus with diabetic macular edema, resolved following treatment, right eye: Secondary | ICD-10-CM | POA: Diagnosis not present

## 2021-11-05 DIAGNOSIS — R278 Other lack of coordination: Secondary | ICD-10-CM | POA: Diagnosis not present

## 2021-11-05 DIAGNOSIS — H27131 Posterior dislocation of lens, right eye: Secondary | ICD-10-CM | POA: Diagnosis not present

## 2021-11-05 DIAGNOSIS — T8522XA Displacement of intraocular lens, initial encounter: Secondary | ICD-10-CM | POA: Diagnosis not present

## 2021-11-05 DIAGNOSIS — H2511 Age-related nuclear cataract, right eye: Secondary | ICD-10-CM | POA: Diagnosis not present

## 2021-11-05 DIAGNOSIS — H5410 Blindness, one eye, low vision other eye, unspecified eyes: Secondary | ICD-10-CM | POA: Diagnosis not present

## 2021-11-05 DIAGNOSIS — G20C Parkinsonism, unspecified: Secondary | ICD-10-CM | POA: Diagnosis not present

## 2021-11-05 DIAGNOSIS — J449 Chronic obstructive pulmonary disease, unspecified: Secondary | ICD-10-CM | POA: Diagnosis not present

## 2021-11-05 DIAGNOSIS — R488 Other symbolic dysfunctions: Secondary | ICD-10-CM | POA: Diagnosis not present

## 2021-11-05 DIAGNOSIS — E1136 Type 2 diabetes mellitus with diabetic cataract: Secondary | ICD-10-CM | POA: Diagnosis not present

## 2021-11-06 DIAGNOSIS — Z9841 Cataract extraction status, right eye: Secondary | ICD-10-CM | POA: Diagnosis not present

## 2021-11-06 DIAGNOSIS — E1137X1 Type 2 diabetes mellitus with diabetic macular edema, resolved following treatment, right eye: Secondary | ICD-10-CM | POA: Diagnosis not present

## 2021-11-06 DIAGNOSIS — E1136 Type 2 diabetes mellitus with diabetic cataract: Secondary | ICD-10-CM | POA: Diagnosis not present

## 2021-11-06 DIAGNOSIS — H4311 Vitreous hemorrhage, right eye: Secondary | ICD-10-CM | POA: Diagnosis not present

## 2021-11-06 DIAGNOSIS — E083541 Diabetes mellitus due to underlying condition with proliferative diabetic retinopathy with combined traction retinal detachment and rhegmatogenous retinal detachment, right eye: Secondary | ICD-10-CM | POA: Diagnosis not present

## 2021-11-06 DIAGNOSIS — K219 Gastro-esophageal reflux disease without esophagitis: Secondary | ICD-10-CM | POA: Diagnosis not present

## 2021-11-06 DIAGNOSIS — G20C Parkinsonism, unspecified: Secondary | ICD-10-CM | POA: Diagnosis not present

## 2021-11-09 DIAGNOSIS — J449 Chronic obstructive pulmonary disease, unspecified: Secondary | ICD-10-CM | POA: Diagnosis not present

## 2021-11-09 DIAGNOSIS — K219 Gastro-esophageal reflux disease without esophagitis: Secondary | ICD-10-CM | POA: Diagnosis not present

## 2021-11-09 DIAGNOSIS — Z9841 Cataract extraction status, right eye: Secondary | ICD-10-CM | POA: Diagnosis not present

## 2021-11-09 DIAGNOSIS — E1136 Type 2 diabetes mellitus with diabetic cataract: Secondary | ICD-10-CM | POA: Diagnosis not present

## 2021-11-09 DIAGNOSIS — H4311 Vitreous hemorrhage, right eye: Secondary | ICD-10-CM | POA: Diagnosis not present

## 2021-11-11 DIAGNOSIS — K5904 Chronic idiopathic constipation: Secondary | ICD-10-CM | POA: Diagnosis not present

## 2021-11-11 DIAGNOSIS — E083541 Diabetes mellitus due to underlying condition with proliferative diabetic retinopathy with combined traction retinal detachment and rhegmatogenous retinal detachment, right eye: Secondary | ICD-10-CM | POA: Diagnosis not present

## 2021-11-11 DIAGNOSIS — H548 Legal blindness, as defined in USA: Secondary | ICD-10-CM | POA: Diagnosis not present

## 2021-11-11 DIAGNOSIS — J449 Chronic obstructive pulmonary disease, unspecified: Secondary | ICD-10-CM | POA: Diagnosis not present

## 2021-11-11 DIAGNOSIS — R627 Adult failure to thrive: Secondary | ICD-10-CM | POA: Diagnosis not present

## 2021-11-11 DIAGNOSIS — K219 Gastro-esophageal reflux disease without esophagitis: Secondary | ICD-10-CM | POA: Diagnosis not present

## 2021-11-12 DIAGNOSIS — H27131 Posterior dislocation of lens, right eye: Secondary | ICD-10-CM | POA: Diagnosis not present

## 2021-11-12 DIAGNOSIS — Z48810 Encounter for surgical aftercare following surgery on the sense organs: Secondary | ICD-10-CM | POA: Diagnosis not present

## 2021-11-12 DIAGNOSIS — G20C Parkinsonism, unspecified: Secondary | ICD-10-CM | POA: Diagnosis not present

## 2021-11-12 DIAGNOSIS — Z7984 Long term (current) use of oral hypoglycemic drugs: Secondary | ICD-10-CM | POA: Diagnosis not present

## 2021-11-12 DIAGNOSIS — E083541 Diabetes mellitus due to underlying condition with proliferative diabetic retinopathy with combined traction retinal detachment and rhegmatogenous retinal detachment, right eye: Secondary | ICD-10-CM | POA: Diagnosis not present

## 2021-11-12 DIAGNOSIS — E1136 Type 2 diabetes mellitus with diabetic cataract: Secondary | ICD-10-CM | POA: Diagnosis not present

## 2021-11-12 DIAGNOSIS — E113551 Type 2 diabetes mellitus with stable proliferative diabetic retinopathy, right eye: Secondary | ICD-10-CM | POA: Diagnosis not present

## 2021-11-12 DIAGNOSIS — Z9841 Cataract extraction status, right eye: Secondary | ICD-10-CM | POA: Diagnosis not present

## 2021-11-12 DIAGNOSIS — K219 Gastro-esophageal reflux disease without esophagitis: Secondary | ICD-10-CM | POA: Diagnosis not present

## 2021-11-12 DIAGNOSIS — H2511 Age-related nuclear cataract, right eye: Secondary | ICD-10-CM | POA: Diagnosis not present

## 2021-11-12 DIAGNOSIS — H4311 Vitreous hemorrhage, right eye: Secondary | ICD-10-CM | POA: Diagnosis not present

## 2021-11-16 DIAGNOSIS — G20C Parkinsonism, unspecified: Secondary | ICD-10-CM | POA: Diagnosis not present

## 2021-11-16 DIAGNOSIS — K219 Gastro-esophageal reflux disease without esophagitis: Secondary | ICD-10-CM | POA: Diagnosis not present

## 2021-11-16 DIAGNOSIS — E1136 Type 2 diabetes mellitus with diabetic cataract: Secondary | ICD-10-CM | POA: Diagnosis not present

## 2021-11-16 DIAGNOSIS — E083541 Diabetes mellitus due to underlying condition with proliferative diabetic retinopathy with combined traction retinal detachment and rhegmatogenous retinal detachment, right eye: Secondary | ICD-10-CM | POA: Diagnosis not present

## 2021-11-16 DIAGNOSIS — Z9841 Cataract extraction status, right eye: Secondary | ICD-10-CM | POA: Diagnosis not present

## 2021-11-16 DIAGNOSIS — H4311 Vitreous hemorrhage, right eye: Secondary | ICD-10-CM | POA: Diagnosis not present

## 2021-11-18 DIAGNOSIS — H4301 Vitreous prolapse, right eye: Secondary | ICD-10-CM | POA: Diagnosis not present

## 2021-11-18 DIAGNOSIS — Z23 Encounter for immunization: Secondary | ICD-10-CM | POA: Diagnosis not present

## 2021-11-18 DIAGNOSIS — T8522XA Displacement of intraocular lens, initial encounter: Secondary | ICD-10-CM | POA: Diagnosis not present

## 2021-11-18 DIAGNOSIS — K219 Gastro-esophageal reflux disease without esophagitis: Secondary | ICD-10-CM | POA: Diagnosis not present

## 2021-11-18 DIAGNOSIS — E119 Type 2 diabetes mellitus without complications: Secondary | ICD-10-CM | POA: Diagnosis not present

## 2021-11-18 DIAGNOSIS — J449 Chronic obstructive pulmonary disease, unspecified: Secondary | ICD-10-CM | POA: Diagnosis not present

## 2021-11-18 DIAGNOSIS — Z7984 Long term (current) use of oral hypoglycemic drugs: Secondary | ICD-10-CM | POA: Diagnosis not present

## 2021-11-18 DIAGNOSIS — H27131 Posterior dislocation of lens, right eye: Secondary | ICD-10-CM | POA: Diagnosis not present

## 2021-11-19 DIAGNOSIS — E1136 Type 2 diabetes mellitus with diabetic cataract: Secondary | ICD-10-CM | POA: Diagnosis not present

## 2021-11-19 DIAGNOSIS — G20C Parkinsonism, unspecified: Secondary | ICD-10-CM | POA: Diagnosis not present

## 2021-11-19 DIAGNOSIS — Z9841 Cataract extraction status, right eye: Secondary | ICD-10-CM | POA: Diagnosis not present

## 2021-11-19 DIAGNOSIS — H27131 Posterior dislocation of lens, right eye: Secondary | ICD-10-CM | POA: Diagnosis not present

## 2021-11-19 DIAGNOSIS — H4311 Vitreous hemorrhage, right eye: Secondary | ICD-10-CM | POA: Diagnosis not present

## 2021-11-19 DIAGNOSIS — E113551 Type 2 diabetes mellitus with stable proliferative diabetic retinopathy, right eye: Secondary | ICD-10-CM | POA: Diagnosis not present

## 2021-11-19 DIAGNOSIS — K219 Gastro-esophageal reflux disease without esophagitis: Secondary | ICD-10-CM | POA: Diagnosis not present

## 2021-11-19 DIAGNOSIS — H2511 Age-related nuclear cataract, right eye: Secondary | ICD-10-CM | POA: Diagnosis not present

## 2021-11-19 DIAGNOSIS — E083541 Diabetes mellitus due to underlying condition with proliferative diabetic retinopathy with combined traction retinal detachment and rhegmatogenous retinal detachment, right eye: Secondary | ICD-10-CM | POA: Diagnosis not present

## 2021-11-19 DIAGNOSIS — Z7984 Long term (current) use of oral hypoglycemic drugs: Secondary | ICD-10-CM | POA: Diagnosis not present

## 2021-11-19 DIAGNOSIS — Z48810 Encounter for surgical aftercare following surgery on the sense organs: Secondary | ICD-10-CM | POA: Diagnosis not present

## 2021-11-24 DIAGNOSIS — E083541 Diabetes mellitus due to underlying condition with proliferative diabetic retinopathy with combined traction retinal detachment and rhegmatogenous retinal detachment, right eye: Secondary | ICD-10-CM | POA: Diagnosis not present

## 2021-11-24 DIAGNOSIS — H4311 Vitreous hemorrhage, right eye: Secondary | ICD-10-CM | POA: Diagnosis not present

## 2021-11-26 DIAGNOSIS — Z79899 Other long term (current) drug therapy: Secondary | ICD-10-CM | POA: Diagnosis not present

## 2021-11-26 DIAGNOSIS — E083541 Diabetes mellitus due to underlying condition with proliferative diabetic retinopathy with combined traction retinal detachment and rhegmatogenous retinal detachment, right eye: Secondary | ICD-10-CM | POA: Diagnosis not present

## 2021-11-26 DIAGNOSIS — E1136 Type 2 diabetes mellitus with diabetic cataract: Secondary | ICD-10-CM | POA: Diagnosis not present

## 2021-11-26 DIAGNOSIS — T8522XA Displacement of intraocular lens, initial encounter: Secondary | ICD-10-CM | POA: Diagnosis not present

## 2021-11-26 DIAGNOSIS — Z48810 Encounter for surgical aftercare following surgery on the sense organs: Secondary | ICD-10-CM | POA: Diagnosis not present

## 2021-11-26 DIAGNOSIS — H2511 Age-related nuclear cataract, right eye: Secondary | ICD-10-CM | POA: Diagnosis not present

## 2021-11-26 DIAGNOSIS — Z9841 Cataract extraction status, right eye: Secondary | ICD-10-CM | POA: Diagnosis not present

## 2021-11-26 DIAGNOSIS — H4311 Vitreous hemorrhage, right eye: Secondary | ICD-10-CM | POA: Diagnosis not present

## 2021-11-26 DIAGNOSIS — E113551 Type 2 diabetes mellitus with stable proliferative diabetic retinopathy, right eye: Secondary | ICD-10-CM | POA: Diagnosis not present

## 2021-11-26 DIAGNOSIS — Z7984 Long term (current) use of oral hypoglycemic drugs: Secondary | ICD-10-CM | POA: Diagnosis not present

## 2021-11-26 DIAGNOSIS — H3581 Retinal edema: Secondary | ICD-10-CM | POA: Diagnosis not present

## 2021-12-01 DIAGNOSIS — E7800 Pure hypercholesterolemia, unspecified: Secondary | ICD-10-CM | POA: Diagnosis not present

## 2021-12-01 DIAGNOSIS — E1165 Type 2 diabetes mellitus with hyperglycemia: Secondary | ICD-10-CM | POA: Diagnosis not present

## 2021-12-01 DIAGNOSIS — J449 Chronic obstructive pulmonary disease, unspecified: Secondary | ICD-10-CM | POA: Diagnosis not present

## 2021-12-01 DIAGNOSIS — I700 Atherosclerosis of aorta: Secondary | ICD-10-CM | POA: Diagnosis not present

## 2021-12-01 DIAGNOSIS — E11319 Type 2 diabetes mellitus with unspecified diabetic retinopathy without macular edema: Secondary | ICD-10-CM | POA: Diagnosis not present

## 2021-12-01 DIAGNOSIS — H548 Legal blindness, as defined in USA: Secondary | ICD-10-CM | POA: Diagnosis not present

## 2021-12-17 DIAGNOSIS — Z48810 Encounter for surgical aftercare following surgery on the sense organs: Secondary | ICD-10-CM | POA: Diagnosis not present

## 2021-12-17 DIAGNOSIS — H30031 Focal chorioretinal inflammation, peripheral, right eye: Secondary | ICD-10-CM | POA: Diagnosis not present

## 2021-12-17 DIAGNOSIS — Z7984 Long term (current) use of oral hypoglycemic drugs: Secondary | ICD-10-CM | POA: Diagnosis not present

## 2021-12-17 DIAGNOSIS — E113551 Type 2 diabetes mellitus with stable proliferative diabetic retinopathy, right eye: Secondary | ICD-10-CM | POA: Diagnosis not present

## 2021-12-17 DIAGNOSIS — H2511 Age-related nuclear cataract, right eye: Secondary | ICD-10-CM | POA: Diagnosis not present

## 2021-12-17 DIAGNOSIS — E1136 Type 2 diabetes mellitus with diabetic cataract: Secondary | ICD-10-CM | POA: Diagnosis not present

## 2021-12-17 DIAGNOSIS — Z961 Presence of intraocular lens: Secondary | ICD-10-CM | POA: Diagnosis not present

## 2021-12-17 DIAGNOSIS — H4311 Vitreous hemorrhage, right eye: Secondary | ICD-10-CM | POA: Diagnosis not present

## 2022-08-27 DIAGNOSIS — H548 Legal blindness, as defined in USA: Secondary | ICD-10-CM | POA: Diagnosis not present

## 2022-08-30 DIAGNOSIS — H548 Legal blindness, as defined in USA: Secondary | ICD-10-CM | POA: Diagnosis not present

## 2022-08-31 DIAGNOSIS — H548 Legal blindness, as defined in USA: Secondary | ICD-10-CM | POA: Diagnosis not present

## 2022-09-02 DIAGNOSIS — H548 Legal blindness, as defined in USA: Secondary | ICD-10-CM | POA: Diagnosis not present

## 2022-09-07 DIAGNOSIS — H53131 Sudden visual loss, right eye: Secondary | ICD-10-CM | POA: Diagnosis not present

## 2022-09-07 DIAGNOSIS — I708 Atherosclerosis of other arteries: Secondary | ICD-10-CM | POA: Diagnosis not present

## 2022-09-07 DIAGNOSIS — Z043 Encounter for examination and observation following other accident: Secondary | ICD-10-CM | POA: Diagnosis not present

## 2022-09-07 DIAGNOSIS — H5461 Unqualified visual loss, right eye, normal vision left eye: Secondary | ICD-10-CM | POA: Diagnosis not present

## 2022-09-07 DIAGNOSIS — G238 Other specified degenerative diseases of basal ganglia: Secondary | ICD-10-CM | POA: Diagnosis not present

## 2022-09-10 DIAGNOSIS — Z0000 Encounter for general adult medical examination without abnormal findings: Secondary | ICD-10-CM | POA: Diagnosis not present

## 2022-09-10 DIAGNOSIS — M549 Dorsalgia, unspecified: Secondary | ICD-10-CM | POA: Diagnosis not present

## 2022-09-10 DIAGNOSIS — I700 Atherosclerosis of aorta: Secondary | ICD-10-CM | POA: Diagnosis not present

## 2022-09-10 DIAGNOSIS — J449 Chronic obstructive pulmonary disease, unspecified: Secondary | ICD-10-CM | POA: Diagnosis not present

## 2022-09-10 DIAGNOSIS — G2581 Restless legs syndrome: Secondary | ICD-10-CM | POA: Diagnosis not present

## 2022-09-10 DIAGNOSIS — E1165 Type 2 diabetes mellitus with hyperglycemia: Secondary | ICD-10-CM | POA: Diagnosis not present

## 2022-09-10 DIAGNOSIS — E11319 Type 2 diabetes mellitus with unspecified diabetic retinopathy without macular edema: Secondary | ICD-10-CM | POA: Diagnosis not present

## 2022-09-10 DIAGNOSIS — Z9181 History of falling: Secondary | ICD-10-CM | POA: Diagnosis not present

## 2022-09-10 DIAGNOSIS — E7800 Pure hypercholesterolemia, unspecified: Secondary | ICD-10-CM | POA: Diagnosis not present

## 2022-09-10 DIAGNOSIS — R269 Unspecified abnormalities of gait and mobility: Secondary | ICD-10-CM | POA: Diagnosis not present

## 2022-10-05 DIAGNOSIS — H548 Legal blindness, as defined in USA: Secondary | ICD-10-CM | POA: Diagnosis not present

## 2022-11-02 DIAGNOSIS — H548 Legal blindness, as defined in USA: Secondary | ICD-10-CM | POA: Diagnosis not present

## 2023-02-09 DIAGNOSIS — E11311 Type 2 diabetes mellitus with unspecified diabetic retinopathy with macular edema: Secondary | ICD-10-CM | POA: Diagnosis not present

## 2023-02-09 DIAGNOSIS — K5900 Constipation, unspecified: Secondary | ICD-10-CM | POA: Diagnosis not present

## 2023-02-09 DIAGNOSIS — N200 Calculus of kidney: Secondary | ICD-10-CM | POA: Diagnosis not present

## 2023-02-09 DIAGNOSIS — R0602 Shortness of breath: Secondary | ICD-10-CM | POA: Diagnosis not present

## 2023-02-09 DIAGNOSIS — K219 Gastro-esophageal reflux disease without esophagitis: Secondary | ICD-10-CM | POA: Diagnosis not present

## 2023-02-09 DIAGNOSIS — R300 Dysuria: Secondary | ICD-10-CM | POA: Diagnosis not present

## 2023-02-09 DIAGNOSIS — B9689 Other specified bacterial agents as the cause of diseases classified elsewhere: Secondary | ICD-10-CM | POA: Diagnosis not present

## 2023-02-09 DIAGNOSIS — R3912 Poor urinary stream: Secondary | ICD-10-CM | POA: Diagnosis not present

## 2023-02-09 DIAGNOSIS — B957 Other staphylococcus as the cause of diseases classified elsewhere: Secondary | ICD-10-CM | POA: Diagnosis not present

## 2023-02-09 DIAGNOSIS — M5127 Other intervertebral disc displacement, lumbosacral region: Secondary | ICD-10-CM | POA: Diagnosis not present

## 2023-02-09 DIAGNOSIS — M4316 Spondylolisthesis, lumbar region: Secondary | ICD-10-CM | POA: Diagnosis not present

## 2023-02-09 DIAGNOSIS — R278 Other lack of coordination: Secondary | ICD-10-CM | POA: Diagnosis not present

## 2023-02-09 DIAGNOSIS — I5189 Other ill-defined heart diseases: Secondary | ICD-10-CM | POA: Diagnosis not present

## 2023-02-09 DIAGNOSIS — E1165 Type 2 diabetes mellitus with hyperglycemia: Secondary | ICD-10-CM | POA: Diagnosis not present

## 2023-02-09 DIAGNOSIS — R7881 Bacteremia: Secondary | ICD-10-CM | POA: Diagnosis not present

## 2023-02-09 DIAGNOSIS — N390 Urinary tract infection, site not specified: Secondary | ICD-10-CM | POA: Diagnosis not present

## 2023-02-09 DIAGNOSIS — N138 Other obstructive and reflux uropathy: Secondary | ICD-10-CM | POA: Diagnosis not present

## 2023-02-09 DIAGNOSIS — J449 Chronic obstructive pulmonary disease, unspecified: Secondary | ICD-10-CM | POA: Diagnosis not present

## 2023-02-09 DIAGNOSIS — K5909 Other constipation: Secondary | ICD-10-CM | POA: Diagnosis not present

## 2023-02-09 DIAGNOSIS — R059 Cough, unspecified: Secondary | ICD-10-CM | POA: Diagnosis not present

## 2023-02-09 DIAGNOSIS — M48061 Spinal stenosis, lumbar region without neurogenic claudication: Secondary | ICD-10-CM | POA: Diagnosis not present

## 2023-02-09 DIAGNOSIS — B9561 Methicillin susceptible Staphylococcus aureus infection as the cause of diseases classified elsewhere: Secondary | ICD-10-CM | POA: Diagnosis not present

## 2023-02-09 DIAGNOSIS — M5126 Other intervertebral disc displacement, lumbar region: Secondary | ICD-10-CM | POA: Diagnosis not present

## 2023-02-09 DIAGNOSIS — M6281 Muscle weakness (generalized): Secondary | ICD-10-CM | POA: Diagnosis not present

## 2023-02-09 DIAGNOSIS — M545 Low back pain, unspecified: Secondary | ICD-10-CM | POA: Diagnosis not present

## 2023-02-09 DIAGNOSIS — R531 Weakness: Secondary | ICD-10-CM | POA: Diagnosis not present

## 2023-02-09 DIAGNOSIS — I10 Essential (primary) hypertension: Secondary | ICD-10-CM | POA: Diagnosis not present

## 2023-02-09 DIAGNOSIS — G2581 Restless legs syndrome: Secondary | ICD-10-CM | POA: Diagnosis not present

## 2023-02-09 DIAGNOSIS — R2689 Other abnormalities of gait and mobility: Secondary | ICD-10-CM | POA: Diagnosis not present

## 2023-02-09 DIAGNOSIS — E11319 Type 2 diabetes mellitus with unspecified diabetic retinopathy without macular edema: Secondary | ICD-10-CM | POA: Diagnosis not present

## 2023-02-09 DIAGNOSIS — N3289 Other specified disorders of bladder: Secondary | ICD-10-CM | POA: Diagnosis not present

## 2023-02-09 DIAGNOSIS — R350 Frequency of micturition: Secondary | ICD-10-CM | POA: Diagnosis not present

## 2023-02-09 DIAGNOSIS — R1010 Upper abdominal pain, unspecified: Secondary | ICD-10-CM | POA: Diagnosis not present

## 2023-02-09 DIAGNOSIS — R9389 Abnormal findings on diagnostic imaging of other specified body structures: Secondary | ICD-10-CM | POA: Diagnosis not present

## 2023-02-09 DIAGNOSIS — M5136 Other intervertebral disc degeneration, lumbar region with discogenic back pain only: Secondary | ICD-10-CM | POA: Diagnosis not present

## 2023-02-09 DIAGNOSIS — N3000 Acute cystitis without hematuria: Secondary | ICD-10-CM | POA: Diagnosis not present

## 2023-02-09 DIAGNOSIS — Z6820 Body mass index (BMI) 20.0-20.9, adult: Secondary | ICD-10-CM | POA: Diagnosis not present

## 2023-02-09 DIAGNOSIS — Z4682 Encounter for fitting and adjustment of non-vascular catheter: Secondary | ICD-10-CM | POA: Diagnosis not present

## 2023-02-09 DIAGNOSIS — G8929 Other chronic pain: Secondary | ICD-10-CM | POA: Diagnosis not present

## 2023-02-09 DIAGNOSIS — R112 Nausea with vomiting, unspecified: Secondary | ICD-10-CM | POA: Diagnosis not present

## 2023-02-09 DIAGNOSIS — M549 Dorsalgia, unspecified: Secondary | ICD-10-CM | POA: Diagnosis not present

## 2023-02-09 DIAGNOSIS — R488 Other symbolic dysfunctions: Secondary | ICD-10-CM | POA: Diagnosis not present

## 2023-02-10 DIAGNOSIS — R7881 Bacteremia: Secondary | ICD-10-CM | POA: Diagnosis not present

## 2023-02-11 DIAGNOSIS — N3000 Acute cystitis without hematuria: Secondary | ICD-10-CM | POA: Diagnosis not present

## 2023-02-11 DIAGNOSIS — K5900 Constipation, unspecified: Secondary | ICD-10-CM | POA: Diagnosis not present

## 2023-02-11 DIAGNOSIS — R300 Dysuria: Secondary | ICD-10-CM | POA: Diagnosis not present

## 2023-02-11 DIAGNOSIS — N3289 Other specified disorders of bladder: Secondary | ICD-10-CM | POA: Diagnosis not present

## 2023-02-11 DIAGNOSIS — R350 Frequency of micturition: Secondary | ICD-10-CM | POA: Diagnosis not present

## 2023-02-11 DIAGNOSIS — B9561 Methicillin susceptible Staphylococcus aureus infection as the cause of diseases classified elsewhere: Secondary | ICD-10-CM | POA: Diagnosis not present

## 2023-02-11 DIAGNOSIS — R7881 Bacteremia: Secondary | ICD-10-CM | POA: Diagnosis not present

## 2023-02-11 DIAGNOSIS — E1165 Type 2 diabetes mellitus with hyperglycemia: Secondary | ICD-10-CM | POA: Diagnosis not present

## 2023-02-12 DIAGNOSIS — B9561 Methicillin susceptible Staphylococcus aureus infection as the cause of diseases classified elsewhere: Secondary | ICD-10-CM | POA: Diagnosis not present

## 2023-02-12 DIAGNOSIS — R7881 Bacteremia: Secondary | ICD-10-CM | POA: Diagnosis not present

## 2023-02-12 DIAGNOSIS — I5189 Other ill-defined heart diseases: Secondary | ICD-10-CM | POA: Diagnosis not present

## 2023-02-12 DIAGNOSIS — N3289 Other specified disorders of bladder: Secondary | ICD-10-CM | POA: Diagnosis not present

## 2023-02-12 DIAGNOSIS — E1165 Type 2 diabetes mellitus with hyperglycemia: Secondary | ICD-10-CM | POA: Diagnosis not present

## 2023-02-12 DIAGNOSIS — N3000 Acute cystitis without hematuria: Secondary | ICD-10-CM | POA: Diagnosis not present

## 2023-02-12 DIAGNOSIS — K5900 Constipation, unspecified: Secondary | ICD-10-CM | POA: Diagnosis not present

## 2023-02-12 DIAGNOSIS — R350 Frequency of micturition: Secondary | ICD-10-CM | POA: Diagnosis not present

## 2023-02-13 DIAGNOSIS — K5900 Constipation, unspecified: Secondary | ICD-10-CM | POA: Diagnosis not present

## 2023-02-13 DIAGNOSIS — R7881 Bacteremia: Secondary | ICD-10-CM | POA: Diagnosis not present

## 2023-02-13 DIAGNOSIS — R350 Frequency of micturition: Secondary | ICD-10-CM | POA: Diagnosis not present

## 2023-02-13 DIAGNOSIS — B9561 Methicillin susceptible Staphylococcus aureus infection as the cause of diseases classified elsewhere: Secondary | ICD-10-CM | POA: Diagnosis not present

## 2023-02-13 DIAGNOSIS — N3000 Acute cystitis without hematuria: Secondary | ICD-10-CM | POA: Diagnosis not present

## 2023-02-13 DIAGNOSIS — E1165 Type 2 diabetes mellitus with hyperglycemia: Secondary | ICD-10-CM | POA: Diagnosis not present

## 2023-02-13 DIAGNOSIS — N3289 Other specified disorders of bladder: Secondary | ICD-10-CM | POA: Diagnosis not present

## 2023-02-14 DIAGNOSIS — N390 Urinary tract infection, site not specified: Secondary | ICD-10-CM | POA: Diagnosis not present

## 2023-02-14 DIAGNOSIS — N200 Calculus of kidney: Secondary | ICD-10-CM | POA: Diagnosis not present

## 2023-02-14 DIAGNOSIS — R7881 Bacteremia: Secondary | ICD-10-CM | POA: Diagnosis not present

## 2023-02-14 DIAGNOSIS — B957 Other staphylococcus as the cause of diseases classified elsewhere: Secondary | ICD-10-CM | POA: Diagnosis not present

## 2023-02-14 DIAGNOSIS — N138 Other obstructive and reflux uropathy: Secondary | ICD-10-CM | POA: Diagnosis not present

## 2023-02-14 DIAGNOSIS — B9561 Methicillin susceptible Staphylococcus aureus infection as the cause of diseases classified elsewhere: Secondary | ICD-10-CM | POA: Diagnosis not present

## 2023-02-15 DIAGNOSIS — R3912 Poor urinary stream: Secondary | ICD-10-CM | POA: Diagnosis not present

## 2023-02-15 DIAGNOSIS — N390 Urinary tract infection, site not specified: Secondary | ICD-10-CM | POA: Diagnosis not present

## 2023-02-15 DIAGNOSIS — B9561 Methicillin susceptible Staphylococcus aureus infection as the cause of diseases classified elsewhere: Secondary | ICD-10-CM | POA: Diagnosis not present

## 2023-02-15 DIAGNOSIS — R7881 Bacteremia: Secondary | ICD-10-CM | POA: Diagnosis not present

## 2023-02-16 DIAGNOSIS — R3912 Poor urinary stream: Secondary | ICD-10-CM | POA: Diagnosis not present

## 2023-02-16 DIAGNOSIS — N390 Urinary tract infection, site not specified: Secondary | ICD-10-CM | POA: Diagnosis not present

## 2023-02-16 DIAGNOSIS — R7881 Bacteremia: Secondary | ICD-10-CM | POA: Diagnosis not present

## 2023-02-16 DIAGNOSIS — B9561 Methicillin susceptible Staphylococcus aureus infection as the cause of diseases classified elsewhere: Secondary | ICD-10-CM | POA: Diagnosis not present

## 2023-02-17 DIAGNOSIS — R7881 Bacteremia: Secondary | ICD-10-CM | POA: Diagnosis not present

## 2023-02-17 DIAGNOSIS — B9561 Methicillin susceptible Staphylococcus aureus infection as the cause of diseases classified elsewhere: Secondary | ICD-10-CM | POA: Diagnosis not present

## 2023-02-18 DIAGNOSIS — N3289 Other specified disorders of bladder: Secondary | ICD-10-CM | POA: Diagnosis not present

## 2023-02-18 DIAGNOSIS — W19XXXD Unspecified fall, subsequent encounter: Secondary | ICD-10-CM | POA: Diagnosis not present

## 2023-02-18 DIAGNOSIS — N390 Urinary tract infection, site not specified: Secondary | ICD-10-CM | POA: Diagnosis not present

## 2023-02-18 DIAGNOSIS — R531 Weakness: Secondary | ICD-10-CM | POA: Diagnosis not present

## 2023-02-18 DIAGNOSIS — Z794 Long term (current) use of insulin: Secondary | ICD-10-CM | POA: Diagnosis not present

## 2023-02-18 DIAGNOSIS — G2581 Restless legs syndrome: Secondary | ICD-10-CM | POA: Diagnosis not present

## 2023-02-18 DIAGNOSIS — R3912 Poor urinary stream: Secondary | ICD-10-CM | POA: Diagnosis not present

## 2023-02-18 DIAGNOSIS — R350 Frequency of micturition: Secondary | ICD-10-CM | POA: Diagnosis not present

## 2023-02-18 DIAGNOSIS — Z4682 Encounter for fitting and adjustment of non-vascular catheter: Secondary | ICD-10-CM | POA: Diagnosis not present

## 2023-02-18 DIAGNOSIS — R5381 Other malaise: Secondary | ICD-10-CM | POA: Diagnosis not present

## 2023-02-18 DIAGNOSIS — N3000 Acute cystitis without hematuria: Secondary | ICD-10-CM | POA: Diagnosis not present

## 2023-02-18 DIAGNOSIS — Z79899 Other long term (current) drug therapy: Secondary | ICD-10-CM | POA: Diagnosis not present

## 2023-02-18 DIAGNOSIS — E1165 Type 2 diabetes mellitus with hyperglycemia: Secondary | ICD-10-CM | POA: Diagnosis not present

## 2023-02-18 DIAGNOSIS — R278 Other lack of coordination: Secondary | ICD-10-CM | POA: Diagnosis not present

## 2023-02-18 DIAGNOSIS — R7881 Bacteremia: Secondary | ICD-10-CM | POA: Diagnosis not present

## 2023-02-18 DIAGNOSIS — J449 Chronic obstructive pulmonary disease, unspecified: Secondary | ICD-10-CM | POA: Diagnosis not present

## 2023-02-18 DIAGNOSIS — B9561 Methicillin susceptible Staphylococcus aureus infection as the cause of diseases classified elsewhere: Secondary | ICD-10-CM | POA: Diagnosis not present

## 2023-02-18 DIAGNOSIS — R300 Dysuria: Secondary | ICD-10-CM | POA: Diagnosis not present

## 2023-02-18 DIAGNOSIS — E11311 Type 2 diabetes mellitus with unspecified diabetic retinopathy with macular edema: Secondary | ICD-10-CM | POA: Diagnosis not present

## 2023-02-18 DIAGNOSIS — R2689 Other abnormalities of gait and mobility: Secondary | ICD-10-CM | POA: Diagnosis not present

## 2023-02-18 DIAGNOSIS — H5440 Blindness, one eye, unspecified eye: Secondary | ICD-10-CM | POA: Diagnosis not present

## 2023-02-18 DIAGNOSIS — Z792 Long term (current) use of antibiotics: Secondary | ICD-10-CM | POA: Diagnosis not present

## 2023-02-18 DIAGNOSIS — R488 Other symbolic dysfunctions: Secondary | ICD-10-CM | POA: Diagnosis not present

## 2023-02-18 DIAGNOSIS — M6281 Muscle weakness (generalized): Secondary | ICD-10-CM | POA: Diagnosis not present

## 2023-02-18 DIAGNOSIS — R059 Cough, unspecified: Secondary | ICD-10-CM | POA: Diagnosis not present

## 2023-02-18 DIAGNOSIS — L603 Nail dystrophy: Secondary | ICD-10-CM | POA: Diagnosis not present

## 2023-02-18 DIAGNOSIS — H548 Legal blindness, as defined in USA: Secondary | ICD-10-CM | POA: Diagnosis not present

## 2023-02-21 DIAGNOSIS — R350 Frequency of micturition: Secondary | ICD-10-CM | POA: Diagnosis not present

## 2023-02-21 DIAGNOSIS — Z794 Long term (current) use of insulin: Secondary | ICD-10-CM | POA: Diagnosis not present

## 2023-02-21 DIAGNOSIS — R7881 Bacteremia: Secondary | ICD-10-CM | POA: Diagnosis not present

## 2023-02-21 DIAGNOSIS — E11311 Type 2 diabetes mellitus with unspecified diabetic retinopathy with macular edema: Secondary | ICD-10-CM | POA: Diagnosis not present

## 2023-02-21 DIAGNOSIS — Z79899 Other long term (current) drug therapy: Secondary | ICD-10-CM | POA: Diagnosis not present

## 2023-02-21 DIAGNOSIS — G2581 Restless legs syndrome: Secondary | ICD-10-CM | POA: Diagnosis not present

## 2023-02-22 DIAGNOSIS — H5440 Blindness, one eye, unspecified eye: Secondary | ICD-10-CM | POA: Diagnosis not present

## 2023-02-22 DIAGNOSIS — E11311 Type 2 diabetes mellitus with unspecified diabetic retinopathy with macular edema: Secondary | ICD-10-CM | POA: Diagnosis not present

## 2023-02-22 DIAGNOSIS — R531 Weakness: Secondary | ICD-10-CM | POA: Diagnosis not present

## 2023-02-22 DIAGNOSIS — N3289 Other specified disorders of bladder: Secondary | ICD-10-CM | POA: Diagnosis not present

## 2023-02-22 DIAGNOSIS — R7881 Bacteremia: Secondary | ICD-10-CM | POA: Diagnosis not present

## 2023-02-22 DIAGNOSIS — J449 Chronic obstructive pulmonary disease, unspecified: Secondary | ICD-10-CM | POA: Diagnosis not present

## 2023-02-22 DIAGNOSIS — G2581 Restless legs syndrome: Secondary | ICD-10-CM | POA: Diagnosis not present

## 2023-02-23 DIAGNOSIS — H548 Legal blindness, as defined in USA: Secondary | ICD-10-CM | POA: Diagnosis not present

## 2023-02-28 DIAGNOSIS — H548 Legal blindness, as defined in USA: Secondary | ICD-10-CM | POA: Diagnosis not present

## 2023-03-01 DIAGNOSIS — R059 Cough, unspecified: Secondary | ICD-10-CM | POA: Diagnosis not present

## 2023-03-01 DIAGNOSIS — L603 Nail dystrophy: Secondary | ICD-10-CM | POA: Diagnosis not present

## 2023-03-01 DIAGNOSIS — R7881 Bacteremia: Secondary | ICD-10-CM | POA: Diagnosis not present

## 2023-03-01 DIAGNOSIS — E11311 Type 2 diabetes mellitus with unspecified diabetic retinopathy with macular edema: Secondary | ICD-10-CM | POA: Diagnosis not present

## 2023-03-02 DIAGNOSIS — E1165 Type 2 diabetes mellitus with hyperglycemia: Secondary | ICD-10-CM | POA: Diagnosis not present

## 2023-03-02 DIAGNOSIS — R300 Dysuria: Secondary | ICD-10-CM | POA: Diagnosis not present

## 2023-03-04 DIAGNOSIS — R278 Other lack of coordination: Secondary | ICD-10-CM | POA: Diagnosis not present

## 2023-03-04 DIAGNOSIS — B9561 Methicillin susceptible Staphylococcus aureus infection as the cause of diseases classified elsewhere: Secondary | ICD-10-CM | POA: Diagnosis not present

## 2023-03-04 DIAGNOSIS — R7881 Bacteremia: Secondary | ICD-10-CM | POA: Diagnosis not present

## 2023-03-04 DIAGNOSIS — R350 Frequency of micturition: Secondary | ICD-10-CM | POA: Diagnosis not present

## 2023-03-04 DIAGNOSIS — Z4682 Encounter for fitting and adjustment of non-vascular catheter: Secondary | ICD-10-CM | POA: Diagnosis not present

## 2023-03-04 DIAGNOSIS — E11311 Type 2 diabetes mellitus with unspecified diabetic retinopathy with macular edema: Secondary | ICD-10-CM | POA: Diagnosis not present

## 2023-03-04 DIAGNOSIS — E1165 Type 2 diabetes mellitus with hyperglycemia: Secondary | ICD-10-CM | POA: Diagnosis not present

## 2023-03-04 DIAGNOSIS — M6281 Muscle weakness (generalized): Secondary | ICD-10-CM | POA: Diagnosis not present

## 2023-03-04 DIAGNOSIS — R2689 Other abnormalities of gait and mobility: Secondary | ICD-10-CM | POA: Diagnosis not present

## 2023-03-04 DIAGNOSIS — N3000 Acute cystitis without hematuria: Secondary | ICD-10-CM | POA: Diagnosis not present

## 2023-03-07 DIAGNOSIS — E1165 Type 2 diabetes mellitus with hyperglycemia: Secondary | ICD-10-CM | POA: Diagnosis not present

## 2023-03-07 DIAGNOSIS — R5381 Other malaise: Secondary | ICD-10-CM | POA: Diagnosis not present

## 2023-03-07 DIAGNOSIS — R059 Cough, unspecified: Secondary | ICD-10-CM | POA: Diagnosis not present

## 2023-03-07 DIAGNOSIS — R7881 Bacteremia: Secondary | ICD-10-CM | POA: Diagnosis not present

## 2023-03-08 DIAGNOSIS — H548 Legal blindness, as defined in USA: Secondary | ICD-10-CM | POA: Diagnosis not present

## 2023-03-09 DIAGNOSIS — R7881 Bacteremia: Secondary | ICD-10-CM | POA: Diagnosis not present

## 2023-03-09 DIAGNOSIS — R059 Cough, unspecified: Secondary | ICD-10-CM | POA: Diagnosis not present

## 2023-03-09 DIAGNOSIS — E1165 Type 2 diabetes mellitus with hyperglycemia: Secondary | ICD-10-CM | POA: Diagnosis not present

## 2023-03-09 DIAGNOSIS — W19XXXD Unspecified fall, subsequent encounter: Secondary | ICD-10-CM | POA: Diagnosis not present

## 2023-03-10 DIAGNOSIS — Z792 Long term (current) use of antibiotics: Secondary | ICD-10-CM | POA: Diagnosis not present

## 2023-03-10 DIAGNOSIS — R7881 Bacteremia: Secondary | ICD-10-CM | POA: Diagnosis not present

## 2023-03-10 DIAGNOSIS — B9561 Methicillin susceptible Staphylococcus aureus infection as the cause of diseases classified elsewhere: Secondary | ICD-10-CM | POA: Diagnosis not present

## 2023-03-11 DIAGNOSIS — H548 Legal blindness, as defined in USA: Secondary | ICD-10-CM | POA: Diagnosis not present

## 2023-03-15 DIAGNOSIS — R7881 Bacteremia: Secondary | ICD-10-CM | POA: Diagnosis not present

## 2023-03-15 DIAGNOSIS — G2581 Restless legs syndrome: Secondary | ICD-10-CM | POA: Diagnosis not present

## 2023-03-15 DIAGNOSIS — H548 Legal blindness, as defined in USA: Secondary | ICD-10-CM | POA: Diagnosis not present

## 2023-03-15 DIAGNOSIS — E11311 Type 2 diabetes mellitus with unspecified diabetic retinopathy with macular edema: Secondary | ICD-10-CM | POA: Diagnosis not present

## 2023-03-15 DIAGNOSIS — E1165 Type 2 diabetes mellitus with hyperglycemia: Secondary | ICD-10-CM | POA: Diagnosis not present

## 2023-03-15 DIAGNOSIS — N3289 Other specified disorders of bladder: Secondary | ICD-10-CM | POA: Diagnosis not present

## 2023-03-15 DIAGNOSIS — J449 Chronic obstructive pulmonary disease, unspecified: Secondary | ICD-10-CM | POA: Diagnosis not present

## 2023-03-16 DIAGNOSIS — H548 Legal blindness, as defined in USA: Secondary | ICD-10-CM | POA: Diagnosis not present

## 2023-03-18 DIAGNOSIS — H548 Legal blindness, as defined in USA: Secondary | ICD-10-CM | POA: Diagnosis not present

## 2023-03-23 DIAGNOSIS — H548 Legal blindness, as defined in USA: Secondary | ICD-10-CM | POA: Diagnosis not present

## 2023-03-31 DIAGNOSIS — N3289 Other specified disorders of bladder: Secondary | ICD-10-CM | POA: Diagnosis not present

## 2023-03-31 DIAGNOSIS — Z87440 Personal history of urinary (tract) infections: Secondary | ICD-10-CM | POA: Diagnosis not present

## 2023-03-31 DIAGNOSIS — R350 Frequency of micturition: Secondary | ICD-10-CM | POA: Diagnosis not present

## 2023-05-26 DIAGNOSIS — T8522XA Displacement of intraocular lens, initial encounter: Secondary | ICD-10-CM | POA: Diagnosis not present

## 2023-05-26 DIAGNOSIS — H2511 Age-related nuclear cataract, right eye: Secondary | ICD-10-CM | POA: Diagnosis not present

## 2023-05-26 DIAGNOSIS — H30031 Focal chorioretinal inflammation, peripheral, right eye: Secondary | ICD-10-CM | POA: Diagnosis not present

## 2023-05-26 DIAGNOSIS — H4311 Vitreous hemorrhage, right eye: Secondary | ICD-10-CM | POA: Diagnosis not present

## 2023-05-26 DIAGNOSIS — H3581 Retinal edema: Secondary | ICD-10-CM | POA: Diagnosis not present

## 2023-05-26 DIAGNOSIS — E113551 Type 2 diabetes mellitus with stable proliferative diabetic retinopathy, right eye: Secondary | ICD-10-CM | POA: Diagnosis not present

## 2023-06-19 DIAGNOSIS — Z87891 Personal history of nicotine dependence: Secondary | ICD-10-CM | POA: Diagnosis not present

## 2023-06-19 DIAGNOSIS — K6389 Other specified diseases of intestine: Secondary | ICD-10-CM | POA: Diagnosis not present

## 2023-06-19 DIAGNOSIS — K529 Noninfective gastroenteritis and colitis, unspecified: Secondary | ICD-10-CM | POA: Diagnosis not present

## 2023-06-19 DIAGNOSIS — N3289 Other specified disorders of bladder: Secondary | ICD-10-CM | POA: Diagnosis not present

## 2023-06-19 DIAGNOSIS — R1084 Generalized abdominal pain: Secondary | ICD-10-CM | POA: Diagnosis not present

## 2023-07-11 DIAGNOSIS — H548 Legal blindness, as defined in USA: Secondary | ICD-10-CM | POA: Diagnosis not present

## 2023-07-11 DIAGNOSIS — G2581 Restless legs syndrome: Secondary | ICD-10-CM | POA: Diagnosis not present

## 2023-07-11 DIAGNOSIS — M549 Dorsalgia, unspecified: Secondary | ICD-10-CM | POA: Diagnosis not present

## 2023-07-11 DIAGNOSIS — J449 Chronic obstructive pulmonary disease, unspecified: Secondary | ICD-10-CM | POA: Diagnosis not present

## 2023-07-11 DIAGNOSIS — E1165 Type 2 diabetes mellitus with hyperglycemia: Secondary | ICD-10-CM | POA: Diagnosis not present

## 2023-07-11 DIAGNOSIS — E113591 Type 2 diabetes mellitus with proliferative diabetic retinopathy without macular edema, right eye: Secondary | ICD-10-CM | POA: Diagnosis not present

## 2023-07-11 DIAGNOSIS — E7800 Pure hypercholesterolemia, unspecified: Secondary | ICD-10-CM | POA: Diagnosis not present

## 2023-07-25 ENCOUNTER — Encounter (INDEPENDENT_AMBULATORY_CARE_PROVIDER_SITE_OTHER): Admitting: Ophthalmology

## 2023-12-13 NOTE — Progress Notes (Addendum)
 Carlos Shelton                                          MRN: [id number]   02/07/2024   The [facility] Quality Team Specialist reviewed this patient medical record for the purposes of chart review for care gap closure. The following were reviewed: chart review for care gap closure-glycemic status assessment and kidney health evaluation for diabetes:eGFR  and uACR.    [facility] Quality Team
